# Patient Record
Sex: Male | Born: 1980 | Race: White | Hispanic: No | Marital: Married | Smoking: Never smoker
Health system: Southern US, Community
[De-identification: ages and names within clinical notes are randomized; demographics above are authoritative.]

## PROBLEM LIST (undated history)

## (undated) DIAGNOSIS — G43909 Migraine, unspecified, not intractable, without status migrainosus: Secondary | ICD-10-CM

## (undated) DIAGNOSIS — T7840XA Allergy, unspecified, initial encounter: Secondary | ICD-10-CM

## (undated) DIAGNOSIS — Z8619 Personal history of other infectious and parasitic diseases: Secondary | ICD-10-CM

## (undated) DIAGNOSIS — R011 Cardiac murmur, unspecified: Secondary | ICD-10-CM

## (undated) HISTORY — DX: Personal history of other infectious and parasitic diseases: Z86.19

## (undated) HISTORY — PX: WISDOM TOOTH EXTRACTION: SHX21

## (undated) HISTORY — DX: Migraine, unspecified, not intractable, without status migrainosus: G43.909

## (undated) HISTORY — DX: Allergy, unspecified, initial encounter: T78.40XA

## (undated) HISTORY — DX: Cardiac murmur, unspecified: R01.1

---

## 2002-05-09 ENCOUNTER — Encounter: Payer: Self-pay | Admitting: Emergency Medicine

## 2002-05-09 ENCOUNTER — Emergency Department (HOSPITAL_COMMUNITY): Admission: EM | Admit: 2002-05-09 | Discharge: 2002-05-10 | Payer: Self-pay | Admitting: Emergency Medicine

## 2005-01-29 ENCOUNTER — Emergency Department (HOSPITAL_COMMUNITY): Admission: EM | Admit: 2005-01-29 | Discharge: 2005-01-29 | Payer: Self-pay | Admitting: *Deleted

## 2005-09-11 ENCOUNTER — Encounter: Admission: RE | Admit: 2005-09-11 | Discharge: 2005-09-11 | Payer: Self-pay | Admitting: Internal Medicine

## 2005-09-11 ENCOUNTER — Ambulatory Visit: Payer: Self-pay | Admitting: Internal Medicine

## 2006-03-03 ENCOUNTER — Ambulatory Visit: Payer: Self-pay | Admitting: Internal Medicine

## 2006-05-07 ENCOUNTER — Ambulatory Visit: Payer: Self-pay | Admitting: Internal Medicine

## 2007-02-09 ENCOUNTER — Telehealth (INDEPENDENT_AMBULATORY_CARE_PROVIDER_SITE_OTHER): Payer: Self-pay | Admitting: *Deleted

## 2007-06-15 ENCOUNTER — Ambulatory Visit: Payer: Self-pay | Admitting: Internal Medicine

## 2007-06-24 ENCOUNTER — Encounter: Payer: Self-pay | Admitting: Internal Medicine

## 2007-10-19 ENCOUNTER — Telehealth (INDEPENDENT_AMBULATORY_CARE_PROVIDER_SITE_OTHER): Payer: Self-pay | Admitting: *Deleted

## 2008-04-15 ENCOUNTER — Telehealth (INDEPENDENT_AMBULATORY_CARE_PROVIDER_SITE_OTHER): Payer: Self-pay | Admitting: *Deleted

## 2008-04-18 ENCOUNTER — Encounter (INDEPENDENT_AMBULATORY_CARE_PROVIDER_SITE_OTHER): Payer: Self-pay | Admitting: *Deleted

## 2008-05-10 ENCOUNTER — Ambulatory Visit: Payer: Self-pay | Admitting: Cardiology

## 2008-05-10 ENCOUNTER — Encounter: Payer: Self-pay | Admitting: Internal Medicine

## 2008-05-10 LAB — CONVERTED CEMR LAB
BUN: 16 mg/dL
CO2: 29 meq/L
Calcium: 9.4 mg/dL
Chloride: 100 meq/L
Cholesterol: 149 mg/dL
Creatinine, Ser: 1.16 mg/dL
Glucose, Bld: 89 mg/dL
HDL: 50 mg/dL
LDL Cholesterol: 80 mg/dL
Potassium: 3.8 meq/L
Sodium: 137 meq/L
Total CHOL/HDL Ratio: 3
Triglycerides: 95 mg/dL
VLDL: 19 mg/dL

## 2008-05-11 ENCOUNTER — Ambulatory Visit (HOSPITAL_COMMUNITY): Admission: RE | Admit: 2008-05-11 | Discharge: 2008-05-11 | Payer: Self-pay | Admitting: Cardiology

## 2008-06-14 ENCOUNTER — Telehealth: Payer: Self-pay | Admitting: Internal Medicine

## 2008-07-27 ENCOUNTER — Ambulatory Visit: Payer: Self-pay | Admitting: Internal Medicine

## 2008-08-09 ENCOUNTER — Encounter: Payer: Self-pay | Admitting: Internal Medicine

## 2008-08-12 ENCOUNTER — Encounter (INDEPENDENT_AMBULATORY_CARE_PROVIDER_SITE_OTHER): Payer: Self-pay | Admitting: *Deleted

## 2009-03-15 ENCOUNTER — Ambulatory Visit: Payer: Self-pay | Admitting: Family Medicine

## 2009-07-28 ENCOUNTER — Ambulatory Visit: Payer: Self-pay | Admitting: Internal Medicine

## 2009-07-28 DIAGNOSIS — K648 Other hemorrhoids: Secondary | ICD-10-CM | POA: Insufficient documentation

## 2009-07-31 LAB — CONVERTED CEMR LAB
ALT: 24 units/L (ref 0–53)
AST: 21 units/L (ref 0–37)
BUN: 13 mg/dL (ref 6–23)
Basophils Absolute: 0 10*3/uL (ref 0.0–0.1)
Basophils Relative: 0.2 % (ref 0.0–3.0)
CO2: 31 meq/L (ref 19–32)
Calcium: 9.4 mg/dL (ref 8.4–10.5)
Chloride: 105 meq/L (ref 96–112)
Cholesterol: 127 mg/dL (ref 0–200)
Creatinine, Ser: 1 mg/dL (ref 0.4–1.5)
Eosinophils Relative: 1.8 % (ref 0.0–5.0)
GFR calc non Af Amer: 95.48 mL/min (ref >60)
HDL: 35.3 mg/dL — ABNORMAL LOW (ref >39.00)
Hemoglobin: 16.1 g/dL (ref 13.0–17.0)
LDL Cholesterol: 79 mg/dL (ref 0–99)
Lymphocytes Relative: 29.5 % (ref 12.0–46.0)
Lymphs Abs: 1.7 10*3/uL (ref 0.7–4.0)
MCHC: 35.4 g/dL (ref 30.0–36.0)
MCV: 94.6 fL (ref 78.0–100.0)
Monocytes Absolute: 0.6 10*3/uL (ref 0.1–1.0)
Monocytes Relative: 10.5 % (ref 3.0–12.0)
Neutro Abs: 3.4 10*3/uL (ref 1.4–7.7)
Platelets: 208 10*3/uL (ref 150.0–400.0)
Potassium: 4.2 meq/L (ref 3.5–5.1)
RBC: 4.8 M/uL (ref 4.22–5.81)
RDW: 11.4 % — ABNORMAL LOW (ref 11.5–14.6)
Sodium: 142 meq/L (ref 135–145)
TSH: 0.79 microintl units/mL (ref 0.35–5.50)
Total CHOL/HDL Ratio: 4
VLDL: 13.2 mg/dL (ref 0.0–40.0)
WBC: 5.8 10*3/uL (ref 4.5–10.5)

## 2010-04-24 ENCOUNTER — Telehealth (INDEPENDENT_AMBULATORY_CARE_PROVIDER_SITE_OTHER): Payer: Self-pay | Admitting: *Deleted

## 2010-05-01 ENCOUNTER — Ambulatory Visit: Payer: Self-pay | Admitting: Internal Medicine

## 2010-05-01 LAB — CONVERTED CEMR LAB: Rapid Strep: POSITIVE

## 2010-05-31 ENCOUNTER — Ambulatory Visit: Payer: Self-pay | Admitting: Internal Medicine

## 2010-08-13 ENCOUNTER — Ambulatory Visit: Payer: Self-pay | Admitting: Internal Medicine

## 2010-08-22 ENCOUNTER — Ambulatory Visit: Payer: Self-pay | Admitting: Internal Medicine

## 2010-08-24 LAB — CONVERTED CEMR LAB
BUN: 17 mg/dL (ref 6–23)
Basophils Absolute: 0 10*3/uL (ref 0.0–0.1)
Chloride: 103 meq/L (ref 96–112)
Cholesterol: 141 mg/dL (ref 0–200)
Creatinine, Ser: 1 mg/dL (ref 0.4–1.5)
Eosinophils Absolute: 0.1 10*3/uL (ref 0.0–0.7)
Eosinophils Relative: 1.7 % (ref 0.0–5.0)
MCHC: 34.6 g/dL (ref 30.0–36.0)
MCV: 92.7 fL (ref 78.0–100.0)
Monocytes Absolute: 0.7 10*3/uL (ref 0.1–1.0)
Neutrophils Relative %: 64.7 % (ref 43.0–77.0)
Platelets: 235 10*3/uL (ref 150.0–400.0)
Triglycerides: 110 mg/dL (ref 0.0–149.0)
WBC: 7.9 10*3/uL (ref 4.5–10.5)

## 2010-09-20 ENCOUNTER — Encounter: Payer: Self-pay | Admitting: Internal Medicine

## 2010-10-11 ENCOUNTER — Encounter: Payer: Self-pay | Admitting: Internal Medicine

## 2010-10-11 ENCOUNTER — Ambulatory Visit (INDEPENDENT_AMBULATORY_CARE_PROVIDER_SITE_OTHER): Payer: 59 | Admitting: Internal Medicine

## 2010-10-11 DIAGNOSIS — L02519 Cutaneous abscess of unspecified hand: Secondary | ICD-10-CM

## 2010-10-11 DIAGNOSIS — L03019 Cellulitis of unspecified finger: Secondary | ICD-10-CM

## 2010-10-11 NOTE — Assessment & Plan Note (Signed)
Summary: strep throat?//lch   Vital Signs:  Patient profile:   30 year old male Height:      68.5 inches Weight:      207 pounds Temp:     99.7 degrees F oral Pulse rate:   80 / minute Resp:     15 per minute BP sitting:   110 / 82  (left arm)  Vitals Entered By: Jeremy Johann CMA (May 01, 2010 4:17 PM)  CC:  URI symptoms.  History of Present Illness:  URI Symptoms      This is a 30 year old man who presents with URI symptoms sincelast night ; onset as ST with chills.  The patient reports sore throat, but denies nasal congestion, purulent nasal discharge, productive cough, and earache.  Associated symptoms include chills, low-grade fever (<100.5 degrees), and  minimal dyspnea.  The patient denies wheezing, vomiting, and diarrhea.  The patient also reports some  fatigue.  The patient denies frontal  headache.  Risk factors for Strep sinusitis include tender adenopathy.  The patient denies the following risk factors for Strep sinusitis:  facial pain and tooth pain.   Rx: NSAIDS, fluids  Current Medications (verified): 1)  Xanax 0.5 Mg  Tabs (Alprazolam) .Marland Kitchen.. 1- 1 1/2 Once Daily As Needed  Allergies (verified): No Known Drug Allergies  Physical Exam  General:  Appears uncomfortable but in no acute distress; alert,appropriate and cooperative throughout examination Ears:  External ear exam shows no significant lesions or deformities.  Otoscopic examination reveals clear canals, tympanic membranes are intact bilaterally without bulging, retraction, inflammation or discharge. Hearing is grossly normal bilaterally. Nose:  External nasal examination shows no deformity or inflammation. Nasal mucosa are pink and moist without lesions or exudates. Mouth:  Oral mucosa and oropharynx without lesions or exudates.  Teeth in good repair 1+ tonsil hypertrophy with erythema  Lungs:  Normal respiratory effort, chest expands symmetrically. Lungs are clear to auscultation, no crackles or  wheezes. Heart:  Normal rate and regular rhythm. S1 and S2 normal without gallop, murmur, click, rub or other extra sounds. Skin:  Intact without suspicious lesions or rashes. Damp Cervical Nodes:  Tender LA , R > L Axillary Nodes:  No palpable lymphadenopathy   Impression & Recommendations:  Problem # 1:  PHARYNGITIS-ACUTE (ICD-462)  Orders: TLB-Beta Strep Screen (87880-STRSCR) Rapid Strep (30865)  His updated medication list for this problem includes:    Amoxicillin 500 Mg Caps (Amoxicillin) .Marland Kitchen... 1 three times a day  Problem # 2:  FEVER (ICD-780.60)  Orders: TLB-Beta Strep Screen (87880-STRSCR) Rapid Strep (78469)  Complete Medication List: 1)  Xanax 0.5 Mg Tabs (Alprazolam) .Marland Kitchen.. 1- 1 1/2 once daily as needed 2)  Amoxicillin 500 Mg Caps (Amoxicillin) .Marland Kitchen.. 1 three times a day  Patient Instructions: 1)  Drink as much fluid as you can tolerate for the next few days. 2)  Take 650-1000mg  of Tylenol every 4-6 hours as needed for relief of pain or comfort of fever AVOID taking more than 4000mg   in a 24 hour period (can cause liver damage in higher doses) OR take 400-600mg  of Ibuprofen (Advil, Motrin) with food every 4-6 hours as needed for relief of pain or comfort of fever.  3)  Recommended remaining out of work for 05/02/2010. Zinc lozenges or Zicam Melts as needed for sore throat. Prescriptions: AMOXICILLIN 500 MG CAPS (AMOXICILLIN) 1 three times a day  #30 x 0   Entered and Authorized by:   Marga Melnick MD   Signed by:  Marga Melnick MD on 05/01/2010   Method used:   Faxed to ...       [facility] [address] #[id number]* (retail)       [address]       [place], [place]  [address]       Ph: [phone]       Fax: [phone]   RxID:   [id number]   Laboratory Results   Date/Time Reported: May 01, 2010 4:42 PM  Other Tests  Rapid Strep: positive

## 2010-10-11 NOTE — Assessment & Plan Note (Signed)
Summary: shin bruised/cbs   Vital Signs:  Patient profile:   30 year old male Weight:      204.4 pounds BMI:     30.74 Temp:     98.5 degrees F oral Pulse rate:   64 / minute Resp:     12 per minute BP sitting:   104 / 68  (left arm) Cuff size:   large  Vitals Entered By: Shonna Chock CMA (May 31, 2010 10:49 AM) CC: Right leg injury x 1 week   CC:  Right leg injury x 1 week.  History of Present Illness: Kicked in L shin playing soccer 05/23/2010; acute swelling treated with ice , elevation & NSAIDS. The pain has persisted, worse with walking & "knot" unchanged.  Current Medications (verified): 1)  Xanax 0.5 Mg  Tabs (Alprazolam) .Marland Kitchen.. 1- 1 1/2 Once Daily As Needed  Allergies (verified): No Known Drug Allergies  Review of Systems General:  Denies chills, fever, and sweats. Resp:  Denies chest pain with inspiration, coughing up blood, and shortness of breath.  Physical Exam  General:  well-nourished,in no acute distress; alert,appropriate and cooperative throughout examination Lungs:  Normal respiratory effort, chest expands symmetrically. Lungs are clear to auscultation, no crackles or wheezes. Heart:  regular rhythm, no murmur, no gallop, no rub, and bradycardia.   Pulses:  R and L dorsalis pedis and posterior tibial pulses are full and equal bilaterally Extremities:  10X 8 cm ecchymosis with 3X3 cm fluctuant hematoma medial to R tibia. Neg Homan's sign   Impression & Recommendations:  Problem # 1:  CONTUSION OF LOWER LEG (ICD-924.10)  Problem # 2:  HEMATOMA (ICD-924.9) secondary to above   Complete Medication List: 1)  Xanax 0.5 Mg Tabs (Alprazolam) .Marland Kitchen.. 1- 1 1/2 once daily as needed 2)  Tramadol Hcl 50 Mg Tabs (Tramadol hcl) .Marland Kitchen.. 1 every 6 hrs as needed for pain  Patient Instructions: 1)  Report Warning Signs as discussed. Measure the bruise & hematoma . Warm , moist compresses three times a day as needed . Prescriptions: TRAMADOL HCL 50 MG TABS  (TRAMADOL HCL) 1 every 6 hrs as needed for pain  #30 x 0   Entered and Authorized by:   Marga Melnick MD   Signed by:   Marga Melnick MD on 05/31/2010   Method used:   Print then Give to Patient   RxID:   [id number]

## 2010-10-11 NOTE — Assessment & Plan Note (Signed)
Summary: cpx/cbs   Vital Signs:  Patient profile:   30 year old male Height:      68.5 inches Weight:      214.13 pounds Pulse rate:   84 / minute Pulse rhythm:   regular BP sitting:   132 / 80  (left arm) Cuff size:   large  Vitals Entered By: Army Fossa CMA (August 13, 2010 3:05 PM) CC: CPX, not fasting  Comments [facility] [address]  pt states he noticied breathing probelm R Nostril.    History of Present Illness: CPX Right nostril congestion for a while, no discharge. Denies allergy symptoms such itchy nose or eyes  Current Medications (verified): 1)  Xanax 0.5 Mg  Tabs (Alprazolam) .Marland Kitchen.. 1- 1 1/2 Once Daily As Needed  Allergies (verified): No Known Drug Allergies  Past History:  Past Medical History: Reviewed history from 07/28/2009 and no changes required. CHEST PAIN, EXERTIONAL   -- s/p CT angiogram (-) per patien      Past Surgical History: Reviewed history from 07/27/2008 and no changes required. no  Family History: DM-- no CAD--GP colon ca--no prostate ca--F in his 77s  lung ca (+)  skin ca (+)  breast ca (+)  PE - PGF  The patient's grandfather had coronary bypass grafting   in his 5s.  His other grandfather had PCI in his 82s as well.  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  2009 Alcohol Use - yes - 2 beers every other day Regular Exercise - yes, 1-2 x week, soccer     Review of Systems General:  Denies fatigue and fever. CV:  Denies chest pain or discomfort and swelling of feet. Resp:  Denies cough and shortness of breath. GI:  Denies bloody stools, diarrhea, nausea, and vomiting. GU:  Denies dysuria and hematuria. Psych:  Denies anxiety and depression.  Physical Exam  General:  alert, well-developed, and well-nourished.   Nose:  right nostril quite congested Neck:  no masses and no thyromegaly.     Lungs:  normal respiratory effort, no intercostal retractions, no accessory muscle use, and normal breath sounds.   Heart:  normal rate, regular rhythm, no murmur, and no gallop.   Abdomen:  soft, non-tender, no distention, no masses, no guarding, and no rigidity.   Extremities:  no pretibial edema bilaterally    Impression & Recommendations:  Problem # 1:  HEALTH SCREENING (ICD-V70.0) Td 2009 had flu shot d/w patient diet-exercise reminded about [facility]        Problem # 2:  ? 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 Trial with Flonase ENT referral  Orders: ENT Referral (ENT)  Complete Medication List: 1)  Xanax 0.5 Mg Tabs (Alprazolam) .Marland Kitchen.. 1- 1 1/2 once daily as needed 2)  Flonase 50 Mcg/act Susp (Fluticasone propionate) .... 2 sprays in each side of the nose daily  Patient Instructions: 1)  flonase as prescribed 2)  please came back  fasting 3)  BMP CBC FLP TSH ---dx v70 4)  Please schedule a follow-up appointment in 1 year.  I will aPrescriptions: XANAX 0.5 MG  TABS (ALPRAZOLAM) 1- 1 1/2 once daily as needed  #30 x 0   Entered and Authorized by:   Elita Quick E. Paz MD   Signed by:   Nolon Rod. Paz MD on 08/13/2010   Method used:   Print  then Give to Patient   RxID:   [id number] FLONASE 50 MCG/ACT SUSP (FLUTICASONE PROPIONATE) 2 sprays in each side of the nose daily  #1 x 2   Entered and Authorized by:   Nolon Rod. Paz MD   Signed by:   Nolon Rod. Paz MD on 08/13/2010   Method used:   Print then Give to Patient   RxID:   [id number]    Orders Added: 1)  ENT Referral [ENT] 2)  Est. Patient age 70-39 [99395]   Immunization History:  Influenza Immunization History:    Influenza:  historical (06/09/2010)   Immunization History:  Influenza Immunization History:    Influenza:  Historical (06/09/2010)

## 2010-10-11 NOTE — Progress Notes (Signed)
Summary: Refill Request  Phone Note Refill Request Message from:  Patient on April 24, 2010 3:46 PM  Refills Requested: Medication #1:  XANAX 0.5 MG  TABS 1- 1 1/2 once daily as needed.   Dosage confirmed as above?Dosage Confirmed   Supply Requested: 1 month Patient needs this for flight in a few weeks.    Method Requested: . Next Appointment Scheduled: none Initial call taken by: Harold Barban,  April 24, 2010 3:46 PM  Follow-up for Phone Call        ok #30, no Rf Jose E. Paz MD  April 25, 2010 9:15 AM     Prescriptions: XANAX 0.5 MG  TABS (ALPRAZOLAM) 1- 1 1/2 once daily as needed  #30 x 0   Entered by:   Army Fossa CMA   Authorized by:   Nolon Rod. Paz MD   Signed by:   Army Fossa CMA on 04/25/2010   Method used:   Printed then faxed to ...       [facility] [address] [facility] [address] #[id number]* (retail)       [address]       [place], [place]  [address]       Ph: [phone]       Fax: [phone]   RxID:   [id number]

## 2010-10-11 NOTE — Consult Note (Signed)
Summary: [facility]  [facility]   Imported By: Maryln Gottron 10/03/2010 15:50:23  _____________________________________________________________________  External Attachment:    Type:   Image     Comment:   External Document

## 2010-10-17 NOTE — Assessment & Plan Note (Signed)
Summary: INFECTION ON KNUCKLE/KN   Vital Signs:  Patient profile:   30 year old male Weight:      211.38 pounds Temp:     98.2 degrees F oral Pulse rate:   68 / minute Pulse rhythm:   regular BP sitting:   124 / 82  (left arm) Cuff size:   large  Vitals Entered By: Army Fossa CMA (October 11, 2010 11:03 AM) CC: Infection in finger  Comments x 1 week [facility] [address]    History of Present Illness:  cut  his left fourth finger a week ago, 2 days ago the area become red, swollen tender  ROS No fever No discharge     Current Medications (verified): 1)  Xanax 0.5 Mg  Tabs (Alprazolam) .Marland Kitchen.. 1- 1 1/2 Once Daily As Needed 2)  Flonase 50 Mcg/act Susp (Fluticasone Propionate) .... 2 Sprays in Each Side of The Nose Daily  Allergies (verified): No Known Drug Allergies  Past History:  Past Medical History: Reviewed history from 07/28/2009 and no changes required. CHEST PAIN, EXERTIONAL   -- s/p CT angiogram (-) per patien      Past Surgical History: Reviewed history from 07/27/2008 and no changes required. no  Physical Exam  General:  alert and well-developed.   Extremities:  left fourth finger @ the dorsal aspect of the PIP he has a 4 mm area of redness, swelling and tenderness. No discharge, no fluctuancy. The finger is slightly swollen otherwise normal. PIP RANGE OF motion slightly decreased d/t  swelling.   Impression & Recommendations:  Problem # 1:  CELLULITIS, FINGER (ICD-681.00) early cellulitis, patient is concerned because his mom had a staph infection in the finger and it  got pretty severe. We'll prescribe cephalexin and Bactroban. Will call if no better in 2 days His updated medication list for this problem includes:    Cephalexin 500 Mg Caps (Cephalexin) .Marland Kitchen... 1 by mouth every 6 hours x 5 days  Complete Medication List: 1)  Xanax 0.5 Mg Tabs (Alprazolam) .Marland Kitchen.. 1- 1 1/2 once daily as needed 2)  Flonase 50 Mcg/act Susp (Fluticasone propionate)  .... 2 sprays in each side of the nose daily 3)  Cephalexin 500 Mg Caps (Cephalexin) .Marland Kitchen.. 1 by mouth every 6 hours x 5 days 4)  Bactroban 2 % Crea (Mupirocin calcium) .... Apply two times a day x 5 days Prescriptions: BACTROBAN 2 % CREA (MUPIROCIN CALCIUM) apply two times a day x 5 days  #1 x 0   Entered and Authorized by:   Nolon Rod. Paz MD   Signed by:   Nolon Rod. Paz MD on 10/11/2010   Method used:   Print then Give to Patient   RxID:   [id number] CEPHALEXIN 500 MG CAPS (CEPHALEXIN) 1 by mouth every 6 hours x 5 days  #20 x 0   Entered and Authorized by:   Nolon Rod. Paz MD   Signed by:   Nolon Rod. Paz MD on 10/11/2010   Method used:   Print then Give to Patient   RxID:   [id number]    Orders Added: 1)  Est. Patient Level II [43329]

## 2011-01-22 NOTE — Assessment & Plan Note (Signed)
[facility]                            CARDIOLOGY OFFICE NOTE   Gregory Novak, Gregory Novak                     MRN:          [id number]  DATE:05/10/2008                            DOB:          [birth year]    PRIMARY CARE PHYSICIAN:  Dr. Willow Ora.   HISTORY OF PRESENT ILLNESS:  This is a 30 year old with no significant  past medical history who presents to the cardiology clinic for  evaluation of chest pain with exertion.  The patient states that, for  the last year-and-a-half to 2 years he has been getting a sharp pressure  feeling in his substernal region with jogging.  This is reproduced every  time he jogs and tends to come after he jogs about three-quarters of a  mile.  He describes its level as moderate 5/10 to 6/10 pain.  It does  radiate to his back.  Does not radiate to his arm or to his neck.  When  he does get the chest pressure, it is severe enough that he has to quit  running.  It will resolve with rest in 2 to 3 minutes and he can at that  point start running again without getting chest pain.  These symptoms  have been stable the last 1.5 to 2 years.  He also plays soccer and he  states he does not get these symptoms with soccer and really only  happens when he jogs, and reproducibly occurs about three-quarters of a  mile.  He does not get shortness of breath associated with these  symptoms.  Has had no episodes of syncope, palpitations or  lightheadedness.  He has no orthopnea or PND.   PAST MEDICAL HISTORY:  The patient really has no significant past  medical history.   The patient takes no medications.   EKG today is normal sinus rhythm and is overall a normal EKG with no Q  waves.  No ST-T wave abnormalities.   FAMILY HISTORY:  The patient's grandfather had coronary bypass grafting  in his 21s.  His other grandfather had PCI in his 40s as well.   SOCIAL HISTORY:  The patient is married.  He works for the [employer].  He  was a smoker in college.  However, does not smoke now.  He drinks approximately 2 beers a day.  He did use cocaine while he was  in college.  However, he has not used any now for the last 3 to 4 years.  He does not use any other illicit drugs.   REVIEW OF SYSTEMS:  Negative, except as noted in the history of present  illness.   PHYSICAL EXAMINATION:  VITAL SIGNS:  Blood pressure 128/78, heart rate  is 67 and regular, weight is 195 pounds.  GENERAL:  Well-developed male in no apparent distress.  NEUROLOGICAL:  Alert and oriented x3.  There is a normal affect.  HEENT:  Normal.  NECK:  No JVD.  No thyromegaly or thyroid nodule.  LUNGS:  Clear to auscultation bilaterally with normal respiratory  effort.  HEART:  Regular S1, S2.  No S3.  No S4.  No murmur.  There is no  peripheral edema.  They are 2+ posterior tibial pulses bilaterally.  ABDOMEN:  Soft, nontender.  No hepatosplenomegaly.  Bowel sounds  present.  MUSCULOSKELETAL:  Normal exam.  SKIN:  Normal exam.  EXTREMITIES:  No clubbing or cyanosis.   ASSESSMENT/PLAN:  Is a 30 year old with a history of exertional chest  pain/pressure that happens reproducibly when he jogs.  Stable symptoms  last 2 years.  It resolves with rest.  EKG reviewed today and is normal.  There is no evidence for a hypertrophic cardiomyopathy pattern.  He does  not have any murmurs on exam.  I think probably based on exam and EKG  that hypertrophic cardiomyopathy is not very likely.  He does not have a  family history of early coronary disease.  To have coronary disease in  someone this young, you would expect him to have a familial  hypercholesterolemia, which there is no evidence for him having based on  his parents and his grandparents.  Finally, another option for a cardiac  source of exertional chest pain for him would be an anomalous coronary  artery with an interarterial path.  I think the workup for him will be:  1. Obtain fasting lipid profile to  rule out significant      hyperlipidemia.  2. Obtain a coronary CT angiogram.  This will allow [place] to look for      coronary artery anomalies as well as to look for any evidence of      hypertrophic cardiomyopathy on the cine images of the heart.  I did      discuss the risk of radiation dosage with him in detail and he      wishes to proceed.     Marca Ancona, MD  Electronically Signed    DM/MedQ  DD: 05/10/2008  DT: 05/10/2008  Job #: [id number]   cc:   Willow Ora, MD  Everardo Beals. Juanda Chance, MD, [facility]

## 2011-08-13 ENCOUNTER — Encounter: Payer: Self-pay | Admitting: Internal Medicine

## 2011-08-15 ENCOUNTER — Encounter: Payer: Self-pay | Admitting: Internal Medicine

## 2011-08-15 ENCOUNTER — Ambulatory Visit (INDEPENDENT_AMBULATORY_CARE_PROVIDER_SITE_OTHER): Payer: 59 | Admitting: Internal Medicine

## 2011-08-15 DIAGNOSIS — Z0000 Encounter for general adult medical examination without abnormal findings: Secondary | ICD-10-CM | POA: Insufficient documentation

## 2011-08-15 DIAGNOSIS — J309 Allergic rhinitis, unspecified: Secondary | ICD-10-CM | POA: Insufficient documentation

## 2011-08-15 DIAGNOSIS — G4700 Insomnia, unspecified: Secondary | ICD-10-CM | POA: Insufficient documentation

## 2011-08-15 LAB — LIPID PANEL
Cholesterol: 147 mg/dL (ref 0–200)
Total CHOL/HDL Ratio: 3
Triglycerides: 87 mg/dL (ref 0.0–149.0)

## 2011-08-15 LAB — COMPREHENSIVE METABOLIC PANEL
Albumin: 4.6 g/dL (ref 3.5–5.2)
BUN: 18 mg/dL (ref 6–23)
CO2: 27 mEq/L (ref 19–32)
Calcium: 9 mg/dL (ref 8.4–10.5)
Chloride: 103 mEq/L (ref 96–112)
Glucose, Bld: 88 mg/dL (ref 70–99)
Potassium: 3.9 mEq/L (ref 3.5–5.1)

## 2011-08-15 MED ORDER — ALPRAZOLAM 0.5 MG PO TABS: 0.5000 mg | ORAL_TABLET | Freq: Every evening | ORAL | Status: DC | PRN

## 2011-08-15 MED ORDER — FLUTICASONE PROPIONATE 50 MCG/ACT NA SUSP: 2.0000 | Freq: Every day | NASAL | Status: DC

## 2011-08-15 NOTE — Progress Notes (Signed)
  Subjective:    Patient ID: Gregory Novak, male    DOB: Jul 17, 1981, 30 y.o.   MRN: [id number]  HPI  CPX  Past Medical History: CHEST PAIN, EXERTIONAL   -- s/p CT angiogram (-) per patient allergies insomnia  Past Surgical History: no  Family History: DM-- no CAD--GF: CABG age 62 , the other GF had PCI in his 67s as well. 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  Respiratory: Negative for cough and shortness of breath.   Cardiovascular: Negative for chest pain and leg swelling.  Gastrointestinal: Negative for nausea, abdominal pain and diarrhea.  Genitourinary: Negative for dysuria and hematuria.  Psychiatric/Behavioral:       No anxiety, depression       Objective:   Physical Exam  Constitutional: He is oriented to person, place, and time. He appears well-developed and well-nourished. No distress.  HENT:  Head: Normocephalic and atraumatic.  Neck:       Nl carotid  Cardiovascular: Normal rate, regular rhythm and normal heart sounds.   No murmur heard. Pulmonary/Chest: Effort normal and breath sounds normal. No respiratory distress. He has no wheezes. He has no rales.  Abdominal: Soft. He exhibits no distension. There is no tenderness. There is no rebound and no guarding.  Musculoskeletal: He exhibits no edema.  Neurological: He is alert and oriented to person, place, and time.  Skin: Skin is warm and dry. He is not diaphoretic.  Psychiatric: He has a normal mood and affect. His behavior is normal. Judgment and thought content normal.            Assessment & Plan:

## 2011-08-15 NOTE — Assessment & Plan Note (Signed)
Td 09 Had a flu shot STE, diet, exercise: counseled Labs  RF chronic meds

## 2011-08-17 ENCOUNTER — Encounter: Payer: Self-pay | Admitting: Internal Medicine

## 2012-01-02 ENCOUNTER — Ambulatory Visit (INDEPENDENT_AMBULATORY_CARE_PROVIDER_SITE_OTHER): Payer: 59 | Admitting: Internal Medicine

## 2012-01-02 ENCOUNTER — Telehealth: Payer: Self-pay | Admitting: Internal Medicine

## 2012-01-02 ENCOUNTER — Encounter: Payer: Self-pay | Admitting: Internal Medicine

## 2012-01-02 VITALS — BP 130/82 | HR 71 | Temp 98.1°F | Wt 218.2 lb

## 2012-01-02 DIAGNOSIS — S8990XA Unspecified injury of unspecified lower leg, initial encounter: Secondary | ICD-10-CM

## 2012-01-02 DIAGNOSIS — S91219A Laceration without foreign body of unspecified toe(s) with damage to nail, initial encounter: Secondary | ICD-10-CM

## 2012-01-02 DIAGNOSIS — S99929A Unspecified injury of unspecified foot, initial encounter: Secondary | ICD-10-CM

## 2012-01-02 DIAGNOSIS — S91109A Unspecified open wound of unspecified toe(s) without damage to nail, initial encounter: Secondary | ICD-10-CM

## 2012-01-02 NOTE — Patient Instructions (Signed)
Dip gauze in  sterile saline and applied to the wound twice a day. Cover the wound with Telfa , non stick dressing  without any antibiotic ointment. The saline can be purchased at the drugstore or you can make your own .Boil cup of salt in a gallon of water. Store mixture  in a clean container.Report Warning  signs as discussed (red streaks, pus, fever, increasing pain). 

## 2012-01-02 NOTE — Progress Notes (Signed)
  Subjective:    Patient ID: Gregory Novak, male    DOB: [birth year], 31 y.o.   MRN: [id number]  HPI   Pt hit right toe on metal camping cot last night around 1130pm. Pt had tetanus shot in last two years. Pt cleaned toe with water. Pt elevated foot all night on a pillow which helped stop the bleeding. Toe nail looks detached from skin for the majority of the nail. Toe nail is grey in color with dried blood. Pt has full ROM of toe and right foot. Pt denies loss of sensation in right foot.  Pt denies diabetes. Pt does not take any blood thinners. Pt took naproxen last night.  Review of Systems     Objective:   Physical Exam He appears healthy and well-nourished in no distress Pedal pulses are intact. Hair growth is good. There is some dried blood under the distal right great toenail. There appears to be a laceration across the base of that nail. The nail cannot be displaced with pressure. There is no pain with flexion of the toe or  with compression of the toe.          Assessment & Plan:    #1 blunt trauma right toe with toenail laceration.  Plan: See recommendations

## 2012-01-02 NOTE — Telephone Encounter (Signed)
Caller: Pearley/Patient; PCP: Willow Ora; CB#: [phone]; ; ; Call regarding Injury To Right Big Toe;  Pt calling to make an appt for Rt big toe injury. Stubbed toe in kitchen 4/24 pm. Nail is split. Afebrile. Bleeding easily stopped last pm. Pt has taken Naproxen for pain relief. Disp: See w/in 4 hrs per Foot Injury. Ok to see w/in 8 hrs per Nursing Judgement. No 4 hr appt's open. Appt made for 1615 on 4/25 with Dr. Alwyn Ren. Call back parameters given.

## 2012-01-02 NOTE — Telephone Encounter (Signed)
Thank you :)

## 2012-09-14 ENCOUNTER — Encounter: Payer: Self-pay | Admitting: *Deleted

## 2012-09-15 ENCOUNTER — Encounter: Payer: Self-pay | Admitting: Internal Medicine

## 2012-09-15 ENCOUNTER — Ambulatory Visit (INDEPENDENT_AMBULATORY_CARE_PROVIDER_SITE_OTHER): Payer: 59 | Admitting: Internal Medicine

## 2012-09-15 VITALS — BP 124/82 | HR 64 | Temp 98.0°F | Ht 70.0 in | Wt 211.0 lb

## 2012-09-15 DIAGNOSIS — Z23 Encounter for immunization: Secondary | ICD-10-CM

## 2012-09-15 DIAGNOSIS — Z0000 Encounter for general adult medical examination without abnormal findings: Secondary | ICD-10-CM

## 2012-09-15 DIAGNOSIS — G4700 Insomnia, unspecified: Secondary | ICD-10-CM

## 2012-09-15 DIAGNOSIS — J309 Allergic rhinitis, unspecified: Secondary | ICD-10-CM

## 2012-09-15 LAB — CBC WITH DIFFERENTIAL/PLATELET
Basophils Absolute: 0 10*3/uL (ref 0.0–0.1)
Basophils Relative: 0.4 % (ref 0.0–3.0)
Eosinophils Relative: 2.7 % (ref 0.0–5.0)
HCT: 45.5 % (ref 39.0–52.0)
Hemoglobin: 15.9 g/dL (ref 13.0–17.0)
Lymphocytes Relative: 32.5 % (ref 12.0–46.0)
Lymphs Abs: 1.8 10*3/uL (ref 0.7–4.0)
MCHC: 35 g/dL (ref 30.0–36.0)
Monocytes Relative: 8.6 % (ref 3.0–12.0)
Neutro Abs: 3.1 10*3/uL (ref 1.4–7.7)
Platelets: 214 10*3/uL (ref 150.0–400.0)
RDW: 12.3 % (ref 11.5–14.6)
WBC: 5.6 10*3/uL (ref 4.5–10.5)

## 2012-09-15 LAB — LIPID PANEL
Cholesterol: 151 mg/dL (ref 0–200)
LDL Cholesterol: 90 mg/dL (ref 0–99)
Total CHOL/HDL Ratio: 3
Triglycerides: 76 mg/dL (ref 0.0–149.0)

## 2012-09-15 LAB — TSH: TSH: 1.53 u[IU]/mL (ref 0.35–5.50)

## 2012-09-15 MED ORDER — FLUTICASONE PROPIONATE 50 MCG/ACT NA SUSP: 2.0000 | Freq: Every day | NASAL | Status: DC

## 2012-09-15 MED ORDER — ALPRAZOLAM 0.5 MG PO TABS: 0.5000 mg | ORAL_TABLET | Freq: Every evening | ORAL | Status: DC | PRN

## 2012-09-15 NOTE — Progress Notes (Signed)
  Subjective:    Patient ID: Gregory Novak, male    DOB: 03/12/1981, 32 y.o.   MRN: [id number]  HPI CPX  Past Medical History  Diagnosis Date  . Chest pain     exertional, reports (-) CT angio of the chest  . Allergic rhinitis 08/15/2011  . Insomnia 08/15/2011   Past Surgical History  Procedure Date  . No past surgeries    History   Social History  . Marital Status: Single    Spouse Name: N/A    Number of Children: 0  . Years of Education: N/A   Occupational History  . printing  [employer]   Social History Main Topics  . Smoking status: Former Games developer  . Smokeless tobacco: Never Used  . Alcohol Use: Yes     Comment: socially   . Drug Use: No  . Sexually Active: Not on file   Other Topics Concern  . Not on file   Social History Narrative   Married 07-2012, no children   Family History  Problem Relation Age of Onset  . Cancer Father 66    prostate  . Diabetes Neg Hx   . Coronary artery disease Other     GP  . Cancer Other     lung,skin,breast  . Stroke Neg Hx     Review of Systems Still has some nasal congestion, currently not taking Flonase, does not see any major difference when takes it. Has not been taking any oral OTCs for allergies. Reports clear nasal discharge on and off and very rarely some sneezing. Rarely uses Xanax No chest pain or shortness of breath No nausea, vomiting, diarrhea. No dysuria or gross hematuria Was seen with hemorrhoids, feels a lot better.    Objective:   Physical Exam General -- alert, well-developed, and well-nourished.   HEENT -- TMs normal, throat w/o redness, face symmetric and not tender to palpation, nose clear Lungs -- normal respiratory effort, no intercostal retractions, no accessory muscle use, and normal breath sounds.   Heart-- normal rate, regular rhythm, no murmur, and no gallop.   Abdomen--soft, non-tender, no distention, no masses, no HSM, no guarding, and no rigidity.   Neurologic-- alert &  oriented X3 and strength normal in all extremities. Psych-- Cognition and judgment appear intact. Alert and cooperative with normal attention span and concentration.  not anxious appearing and not depressed appearing.      Assessment & Plan:

## 2012-09-15 NOTE — Assessment & Plan Note (Signed)
Hardly ever uses alprazolam, uses it  mostly when he flies.

## 2012-09-15 NOTE — Assessment & Plan Note (Signed)
Td 09 flu shot today STE, diet, exercise: counseled Labs

## 2012-09-15 NOTE — Assessment & Plan Note (Signed)
Ongoing nasal congestion, saw ENT 09/2010, was diagnosed with hypertrophic nasal turbinates and prescribe Flonase Plan: Go back on Flonase OTC Claritin Further  eval if not improved, patient will call

## 2012-09-16 ENCOUNTER — Encounter: Payer: Self-pay | Admitting: *Deleted

## 2012-10-08 ENCOUNTER — Encounter: Payer: Self-pay | Admitting: Internal Medicine

## 2013-09-15 ENCOUNTER — Telehealth: Payer: Self-pay

## 2013-09-15 NOTE — Telephone Encounter (Signed)
Left message for call back Non identifiable  Tdap--11/09

## 2013-09-16 NOTE — Telephone Encounter (Signed)
Unable to reach pre visit -- phone tag

## 2013-09-16 NOTE — Telephone Encounter (Signed)
Left message for call back Non identifiable  

## 2013-09-16 NOTE — Telephone Encounter (Signed)
Patient called back for pre-visit call.

## 2013-09-17 ENCOUNTER — Encounter: Payer: Self-pay | Admitting: Internal Medicine

## 2013-09-17 ENCOUNTER — Ambulatory Visit (INDEPENDENT_AMBULATORY_CARE_PROVIDER_SITE_OTHER): Payer: 59 | Admitting: Internal Medicine

## 2013-09-17 VITALS — BP 116/76 | HR 69 | Temp 98.0°F | Ht 69.5 in | Wt 220.0 lb

## 2013-09-17 DIAGNOSIS — Z0000 Encounter for general adult medical examination without abnormal findings: Secondary | ICD-10-CM

## 2013-09-17 DIAGNOSIS — G4700 Insomnia, unspecified: Secondary | ICD-10-CM

## 2013-09-17 DIAGNOSIS — Z23 Encounter for immunization: Secondary | ICD-10-CM

## 2013-09-17 DIAGNOSIS — J309 Allergic rhinitis, unspecified: Secondary | ICD-10-CM

## 2013-09-17 DIAGNOSIS — K648 Other hemorrhoids: Secondary | ICD-10-CM

## 2013-09-17 LAB — COMPREHENSIVE METABOLIC PANEL
ALT: 39 U/L (ref 0–53)
AST: 24 U/L (ref 0–37)
Albumin: 4.2 g/dL (ref 3.5–5.2)
Alkaline Phosphatase: 65 U/L (ref 39–117)
BILIRUBIN TOTAL: 0.8 mg/dL (ref 0.3–1.2)
BUN: 16 mg/dL (ref 6–23)
CO2: 28 meq/L (ref 19–32)
CREATININE: 1 mg/dL (ref 0.4–1.5)
Calcium: 9 mg/dL (ref 8.4–10.5)
Chloride: 106 mEq/L (ref 96–112)
GFR: 93.81 mL/min (ref >60.00)
GLUCOSE: 89 mg/dL (ref 70–99)
Potassium: 4 mEq/L (ref 3.5–5.1)
Sodium: 138 mEq/L (ref 135–145)
Total Protein: 6.9 g/dL (ref 6.0–8.3)

## 2013-09-17 LAB — LIPID PANEL
CHOLESTEROL: 157 mg/dL (ref 0–200)
HDL: 38.2 mg/dL — AB (ref >39.00)
LDL Cholesterol: 103 mg/dL — ABNORMAL HIGH (ref 0–99)
TRIGLYCERIDES: 77 mg/dL (ref 0.0–149.0)
Total CHOL/HDL Ratio: 4
VLDL: 15.4 mg/dL (ref 0.0–40.0)

## 2013-09-17 MED ORDER — ALPRAZOLAM 0.5 MG PO TABS: 0.5000 mg | ORAL_TABLET | Freq: Every evening | ORAL | Status: DC | PRN

## 2013-09-17 MED ORDER — FLUTICASONE PROPIONATE 50 MCG/ACT NA SUSP: 2.0000 | Freq: Every day | NASAL | Status: DC

## 2013-09-17 NOTE — Progress Notes (Signed)
Pre visit review using our clinic review tool, if applicable. No additional management support is needed unless otherwise documented below in the visit note. 

## 2013-09-17 NOTE — Assessment & Plan Note (Signed)
Occasionally sees few drops of blood with bowel movements, recommend observation, will call if symptoms increase

## 2013-09-17 NOTE — Assessment & Plan Note (Signed)
Since the last year he is using Flonase inconsistently, and despite that he feels somehow better, symptoms are on and off. He wonders if he could use Flonase and  feel even better, I do  think so, prescription provided

## 2013-09-17 NOTE — Assessment & Plan Note (Signed)
Td 09 flu shot today STE Has gain some weight ---> diet, exercise discussed  Labs

## 2013-09-17 NOTE — Assessment & Plan Note (Signed)
Uses alprazolam as needed, prescription provided, UDS today

## 2013-09-17 NOTE — Patient Instructions (Signed)
Get your blood work before you leave  Next visit in 1 year for a physical exam, fasting Please make an appointment

## 2013-09-17 NOTE — Progress Notes (Signed)
   Subjective:    Patient ID: Gregory Novak, male    DOB: 08/06/1981, 33 y.o.   MRN: [id number]  HPI CPX  Past Medical History  Diagnosis Date  . Chest pain     exertional, reports (-) CT angio of the chest  . Allergic rhinitis 08/15/2011  . Insomnia 08/15/2011   Past Surgical History  Procedure Laterality Date  . No past surgeries     History   Social History  . Marital Status: Single    Spouse Name: N/A    Number of Children: 0  . Years of Education: N/A   Occupational History  . printing  Unemployed   Social History Main Topics  . Smoking status: Former Games developermoker  . Smokeless tobacco: Never Used  . Alcohol Use: Yes     Comment: socially   . Drug Use: No  . Sexual Activity: Not on file   Other Topics Concern  . Not on file   Social History Narrative   Married 07-2012, no children   Family History  Problem Relation Age of Onset  . Cancer Father 3650    prostate  . Diabetes Neg Hx   . Coronary artery disease Other     GP  . Cancer Other     lung,skin,breast  . Stroke Neg Hx     Review of Systems Diet, Exercise-- improving  No  CP, SOB  Denies  nausea, vomiting diarrhea  blood in the stools x 2 since last year (w/ BM, in the toilet paper, few drops, red blood), h/o hemorrhoids  (-) cough, sputum production (-) wheezing  No dysuria, gross hematuria, difficulty urinating   No anxiety, depression       Objective:   Physical Exam BP 116/76  Pulse 69  Temp(Src) 98 F (36.7 C)  Ht 5' 9.5" (1.765 m)  Wt 220 lb (99.791 kg)  BMI 32.03 kg/m2  SpO2 96% General -- alert, well-developed, NAD.  Neck --no thyromegaly Lungs -- normal respiratory effort, no intercostal retractions, no accessory muscle use, and normal breath sounds.  Heart-- normal rate, regular rhythm, no murmur.  Abdomen-- Not distended, good bowel sounds,soft, non-tender. Extremities-- no pretibial edema bilaterally  Neurologic--  alert & oriented X3. Speech normal, gait normal,  strength normal in all extremities.  Psych-- Cognition and judgment appear intact. Cooperative with normal attention span and concentration. No anxious or depressed appearing.      Assessment & Plan:

## 2013-09-19 ENCOUNTER — Encounter: Payer: Self-pay | Admitting: Internal Medicine

## 2013-09-20 ENCOUNTER — Encounter: Payer: Self-pay | Admitting: *Deleted

## 2013-10-05 ENCOUNTER — Telehealth: Payer: Self-pay

## 2013-10-05 NOTE — Telephone Encounter (Signed)
UDS: 09/20/2013 Negative Alprazolam as needed Low risk per Dr Drue NovelPaz

## 2013-12-01 ENCOUNTER — Encounter: Payer: Self-pay | Admitting: Internal Medicine

## 2014-08-04 ENCOUNTER — Emergency Department (HOSPITAL_COMMUNITY): Admission: EM | Admit: 2014-08-04 | Discharge: 2014-08-04 | Discharge status: Absent without leave | Payer: 59

## 2014-09-21 ENCOUNTER — Encounter: Payer: 59 | Admitting: Internal Medicine

## 2014-12-13 ENCOUNTER — Telehealth: Payer: Self-pay | Admitting: Internal Medicine

## 2014-12-13 NOTE — Telephone Encounter (Signed)
Pre visit letter sent  °

## 2014-12-14 ENCOUNTER — Other Ambulatory Visit: Payer: Self-pay

## 2015-01-02 ENCOUNTER — Telehealth: Payer: Self-pay | Admitting: *Deleted

## 2015-01-02 ENCOUNTER — Encounter: Payer: Self-pay | Admitting: *Deleted

## 2015-01-02 NOTE — Telephone Encounter (Signed)
Unable to reach patient at time of Pre-Visit Call.  Left message for patient to return call when available.    

## 2015-01-02 NOTE — Addendum Note (Signed)
Addended by: Noreene LarssonLARSON, Yasseen Salls A on: 01/02/2015 03:16 PM   Modules accepted: Medications

## 2015-01-02 NOTE — Telephone Encounter (Signed)
Pre-Visit Call completed with patient and chart updated.   Pre-Visit Info documented in Specialty Comments under [employer].    

## 2015-01-03 ENCOUNTER — Encounter: Payer: Self-pay | Admitting: Internal Medicine

## 2015-01-03 ENCOUNTER — Ambulatory Visit (INDEPENDENT_AMBULATORY_CARE_PROVIDER_SITE_OTHER): Payer: 59 | Admitting: Internal Medicine

## 2015-01-03 VITALS — BP 122/78 | HR 71 | Temp 97.8°F | Resp 16 | Ht 70.0 in | Wt 229.0 lb

## 2015-01-03 DIAGNOSIS — G4700 Insomnia, unspecified: Secondary | ICD-10-CM

## 2015-01-03 DIAGNOSIS — Z0000 Encounter for general adult medical examination without abnormal findings: Secondary | ICD-10-CM | POA: Diagnosis not present

## 2015-01-03 DIAGNOSIS — K648 Other hemorrhoids: Secondary | ICD-10-CM

## 2015-01-03 LAB — CBC WITH DIFFERENTIAL/PLATELET
BASOS ABS: 0 10*3/uL (ref 0.0–0.1)
BASOS PCT: 0.4 % (ref 0.0–3.0)
EOS ABS: 0.1 10*3/uL (ref 0.0–0.7)
Eosinophils Relative: 2.6 % (ref 0.0–5.0)
HCT: 47.1 % (ref 39.0–52.0)
Hemoglobin: 16.6 g/dL (ref 13.0–17.0)
Lymphocytes Relative: 39.4 % (ref 12.0–46.0)
Lymphs Abs: 2.2 10*3/uL (ref 0.7–4.0)
MCHC: 35.3 g/dL (ref 30.0–36.0)
MCV: 91.3 fl (ref 78.0–100.0)
MONO ABS: 0.5 10*3/uL (ref 0.1–1.0)
Monocytes Relative: 9 % (ref 3.0–12.0)
NEUTROS PCT: 48.6 % (ref 43.0–77.0)
Neutro Abs: 2.7 10*3/uL (ref 1.4–7.7)
Platelets: 224 10*3/uL (ref 150.0–400.0)
RBC: 5.16 Mil/uL (ref 4.22–5.81)
RDW: 12.8 % (ref 11.5–15.5)
WBC: 5.5 10*3/uL (ref 4.0–10.5)

## 2015-01-03 LAB — LIPID PANEL
CHOL/HDL RATIO: 3
CHOLESTEROL: 131 mg/dL (ref 0–200)
HDL: 40.8 mg/dL (ref >39.00)
LDL Cholesterol: 62 mg/dL (ref 0–99)
NONHDL: 90.2
Triglycerides: 140 mg/dL (ref 0.0–149.0)
VLDL: 28 mg/dL (ref 0.0–40.0)

## 2015-01-03 LAB — TSH: TSH: 1.4 u[IU]/mL (ref 0.35–4.50)

## 2015-01-03 MED ORDER — EPINEPHRINE 0.3 MG/0.3ML IJ SOAJ: 0.3000 mg | Freq: Once | INTRAMUSCULAR | Status: DC

## 2015-01-03 MED ORDER — ALPRAZOLAM 0.5 MG PO TABS: 0.5000 mg | ORAL_TABLET | Freq: Every evening | ORAL | Status: DC | PRN

## 2015-01-03 NOTE — Progress Notes (Signed)
Pre visit review using our clinic review tool, if applicable. No additional management support is needed unless otherwise documented below in the visit note. 

## 2015-01-03 NOTE — Assessment & Plan Note (Addendum)
Td 09 STE diet, exercise discussed  Labs   CBC, FLP, TSH    Recently had a reaction to naproxen, is already listed in his allergies Request a EpiPen which will be provided for bee  allergies

## 2015-01-03 NOTE — Patient Instructions (Signed)
Get your blood work before you leave    Come back to the office in 1 year  for a physical exam  Please schedule an appointment at the front desk    Come back fasting     Testicular Self-Exam A self-examination of your testicles involves looking at and feeling your testicles for abnormal lumps or swelling. Several things can cause swelling, lumps, or pain in your testicles. Some of these causes are:  Injuries.  Inflammation.  Infection.  Accumulation of fluids around your testicle (hydrocele).  Twisted testicles (testicular torsion).  Testicular cancer. Self-examination of the testicles and groin areas may be advised if you are at risk for testicular cancer. Risks for testicular cancer include:  An undescended testicle (cryptorchidism).  A history of previous testicular cancer.  A family history of testicular cancer. The testicles are easiest to examine after warm baths or showers and are more difficult to examine when you are cold. This is because the muscles attached to the testicles retract and pull them up higher or into the abdomen. Follow these steps while you are standing:  Hold your penis away from your body.  Roll one testicle between your thumb and forefinger, feeling the entire testicle.  Roll the other testicle between your thumb and forefinger, feeling the entire testicle. Feel for lumps, swelling, or discomfort. A normal testicle is egg shaped and feels firm. It is smooth and not tender. The spermatic cord can be felt as a firm spaghetti-like cord at the back of your testicle. It is also important to examine the crease between the front of your leg and your abdomen. Feel for any bumps that are tender. These could be enlarged lymph nodes.  Document Released: 12/02/2000 Document Revised: 04/28/2013 Document Reviewed: 02/15/2013 Exit[employer] Patient Information 2015 [employer]. This information is not intended to replace advice given to you by your health care  provider. Make sure you discuss any questions you have with your health care provider.  

## 2015-01-03 NOTE — Progress Notes (Signed)
Subjective:    Patient ID: Gregory Novak, male    DOB: 09/14/1980, 34 y.o.   MRN: [id number]  DOS:  01/03/2015 Type of visit - description : CPX Interval history:  Patient is a 34 year old male with a history of hemorrhoids, allergic rhinitis, and insomnia in today for his annual physical exam.  He states that his hemorrhoid sx have decreased in frequency and severity, still notices occasional blood spotting in stool but has been following advice to decrease straining during BMs. Denies any constipation or diarrhea.   Patient notes his seasonal allergies have subsided and is no longer taking any medications for it. Notes no eye watering/itching, no congestion, no rhinorrhea, no sore throat.  When asked about Xanax use for insomnia, he notes that he only uses the Xanax for anxiety prior to flying. He says that he has no difficulties falling to sleep or staying asleep.  Patient does mention a recent allergic reaction to naproxen which began with itching on his feet and hands progressing to abdominal hives. He has used naproxen in the past with no issues, this is a new allergic reaction.   Review of Systems  Constitutional: No fever, chills. No unexplained wt changes. No unusual sweats HEENT: No dental problems, ear discharge, facial swelling, voice changes. No eye discharge, redness or intolerance to light Respiratory: No wheezing or difficulty breathing. No cough , mucus production Cardiovascular: No CP, leg swelling or palpitations GI: no nausea, vomiting, diarrhea or abdominal pain.  No blood in the stools. No dysphagia   Endocrine: No polyphagia, polyuria or polydipsia GU: No dysuria, gross hematuria, difficulty urinating. No urinary urgency or frequency. Musculoskeletal: No joint swellings or unusual aches or pains Skin: No change in the color of the skin, palor or rash Allergic, immunologic: No environmental allergies or food allergies Neurological: No dizziness or syncope.  No headaches. No diplopia, slurred speech, motor deficits, facial numbness Hematological: No enlarged lymph nodes, easy bruising or bleeding Psychiatry: No suicidal ideas, hallucinations, behavior problems or confusion. No unusual/severe anxiety or depression.     Past Medical History  Diagnosis Date  . Chest pain     exertional, reports (-) CT angio of the chest  . Allergic rhinitis 08/15/2011  . Insomnia 08/15/2011    Past Surgical History  Procedure Laterality Date  . No past surgeries      History   Social History  . Marital Status: Single    Spouse Name: N/A  . Number of Children: 0  . 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 Smoking status: Former Games developermoker  . Smokeless tobacco: Never Used  . Alcohol Use: Yes     Comment: socially   . Drug Use: No  . Sexual Activity: Not on file   Other Topics Concern  . Not on file   Social History Narrative   Married 07-2012, no children     Family History  Problem Relation Age of Onset  . Cancer Father 7650    prostate  . Diabetes Neg Hx   . Coronary artery disease Other     GP  . Cancer Other     lung,skin,breast  . Stroke Neg Hx   . Colon cancer Neg Hx        Medication List       This list is accurate as of: 01/03/15  8:28 AM.  Always use your most recent  med list.               ALPRAZolam 0.5 MG tablet  Commonly known as:  XANAX  Take 1 tablet (0.5 mg total) by mouth at bedtime as needed.     fluticasone 50 MCG/ACT nasal spray  Commonly known as:  FLONASE  Place 2 sprays into both nostrils daily.           Objective:   Physical Exam BP 122/78 mmHg  Pulse 71  Temp(Src) 97.8 F (36.6 C) (Oral)  Resp 16  Ht  (1.778 m)  Wt 229 lb (103.874 kg)  BMI 32.86 kg/m2  SpO2 97%  General:   Well developed, well nourished . NAD.  Neck:  Full range of motion. Supple. No  thyromegaly , normal carotid pulse HEENT:  Normocephalic .  Face symmetric, atraumatic Lungs:  CTA B Normal respiratory effort, no intercostal retractions, no accessory muscle use. Heart: RRR,  no murmur. Distal pulses intact. Abdomen:  Not distended, soft, non-tender. No rebound or rigidity. No mass,organomegaly Muscle skeletal: no pretibial edema bilaterally  Skin: Exposed areas without rash. Not pale. Not jaundice Neurologic:  alert & oriented X3.  Speech normal, gait appropriate for age and unassisted Strength symmetric and appropriate for age.  Psych: Cognition and judgment appear intact.  Cooperative with normal attention span and concentration.  Behavior appropriate. No anxious or depressed appearing.       Assessment & Plan:

## 2015-01-03 NOTE — Assessment & Plan Note (Signed)
Internal Hemorrhoids:  Patient notes great improvement with symptoms.  Will monitor, patient instructed to inform us if symptoms worsen.

## 2015-01-03 NOTE — Assessment & Plan Note (Signed)
Insomnia: Patient states no difficulties sleeping, Xanax use is primarily for anxiety before flying.

## 2015-04-28 ENCOUNTER — Ambulatory Visit (INDEPENDENT_AMBULATORY_CARE_PROVIDER_SITE_OTHER): Payer: 59 | Admitting: Internal Medicine

## 2015-04-28 ENCOUNTER — Encounter: Payer: Self-pay | Admitting: Internal Medicine

## 2015-04-28 ENCOUNTER — Telehealth: Payer: Self-pay | Admitting: *Deleted

## 2015-04-28 ENCOUNTER — Ambulatory Visit (HOSPITAL_BASED_OUTPATIENT_CLINIC_OR_DEPARTMENT_OTHER)
Admission: RE | Admit: 2015-04-28 | Discharge: 2015-04-28 | Disposition: A | Discharge status: Home / Self Care | Payer: 59 | Source: Ambulatory Visit | Attending: Internal Medicine | Admitting: Internal Medicine

## 2015-04-28 VITALS — BP 140/74 | HR 74 | Temp 98.1°F | Ht 70.0 in | Wt 229.4 lb

## 2015-04-28 DIAGNOSIS — R112 Nausea with vomiting, unspecified: Secondary | ICD-10-CM | POA: Insufficient documentation

## 2015-04-28 DIAGNOSIS — R200 Anesthesia of skin: Secondary | ICD-10-CM

## 2015-04-28 DIAGNOSIS — R51 Headache: Secondary | ICD-10-CM | POA: Diagnosis not present

## 2015-04-28 DIAGNOSIS — R519 Headache, unspecified: Secondary | ICD-10-CM

## 2015-04-28 MED ORDER — HYDROCODONE-ACETAMINOPHEN 5-325 MG PO TABS: 1.0000 | ORAL_TABLET | Freq: Three times a day (TID) | ORAL | Status: DC | PRN

## 2015-04-28 MED ORDER — ONDANSETRON HCL 4 MG PO TABS: 4.0000 mg | ORAL_TABLET | Freq: Three times a day (TID) | ORAL | Status: DC | PRN

## 2015-04-28 NOTE — Patient Instructions (Signed)
We are scheduling a CT of the head today  Take Vicodin and Zofran as needed for headache or nausea. Medications may cause drowsiness.  If you have more symptoms: Call the office  If your symptoms are severe, you have facial weakness, strength deficits, double vision, slurred speech or the symptoms last more than 5 minutes: Call 911

## 2015-04-28 NOTE — Progress Notes (Signed)
Subjective:    Patient ID: Gregory Novak, male    DOB: 09-14-1980, 34 y.o.   MRN: [id number]  DOS:  04/28/2015 Type of visit - description : acute visit, here with his wife.  Interval history: last night, he was watching TV at home and suddenly developed numbness at the left leg,it spread to the left abdomen, shoulder, chest, head and left arm. His wife witnessed the episode, there was no motor deficits, no droopy face, he did have some left hand incoordination. Symptoms resolved within 15 minutes but they were follow-up by a severe right-sided headache, it was the worst of his life, there was associated nausea and he vomited one time. The numbness has not come back but he still has mild to moderate lingering headache with mild nausea. No further vomiting.   Review of Systems No fever, chills. No chest pain or palpitations No neck stiffness or a rash Not taking any new medications. No head injury. Some increased stress at work but no anxiety or depression. No seizure activity, bladder or bowel incontinence, diplopia or slurred speech. He did have a mild headache 2 weeks ago.  Past Medical History  Diagnosis Date  . Chest pain     exertional, reports (-) CT angio of the chest  . Allergic rhinitis 08/15/2011  . Insomnia 08/15/2011    Past Surgical History  Procedure Laterality Date  . No past surgeries      Social History   Social History  . Marital Status: Married    Spouse Name: N/A  . Number of Children: 1  . 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 Smoking status: Former Games developer  . Smokeless tobacco: Never Used  . Alcohol Use: Yes     Comment: socially   . Drug Use: No  . Sexual Activity: Not on file   Other Topics Concern  . Not on file   Social History Narrative   Married 07-2012, 1 child         Medication List       This list is accurate as of: 04/28/15 11:59 PM.  Always use your  most recent med list.               ALPRAZolam 0.5 MG tablet  Commonly known as:  XANAX  Take 1 tablet (0.5 mg total) by mouth at bedtime as needed.     EPINEPHrine 0.3 mg/0.3 mL Soaj injection  Commonly known as:  EPIPEN 2-PAK  Inject 0.3 mLs (0.3 mg total) into the muscle once.     HYDROcodone-acetaminophen 5-325 MG per tablet  Commonly known as:  NORCO/VICODIN  Take 1-2 tablets by mouth every 8 (eight) hours as needed.     ondansetron 4 MG tablet  Commonly known as:  ZOFRAN  Take 1 tablet (4 mg total) by mouth every 8 (eight) hours as needed for nausea or vomiting.           Objective:   Physical Exam BP 140/74 mmHg  Pulse 74  Temp(Src) 98.1 F (36.7 C) (Oral)  Ht 5\' 10"  (1.778 m)  Wt 229 lb 6 oz (104.044 kg)  BMI 32.91 kg/m2  SpO2 96% General:   Well developed, well nourished . NAD.  HEENT:  Normocephalic . Face symmetric, atraumatic Neck: Normal carotid arteries Lungs:  CTA B Normal respiratory effort, no intercostal retractions, no accessory muscle use. Heart: RRR,  no murmur.  No pretibial edema bilaterally  Skin: Not pale. Not jaundice Neurologic:  alert & oriented X3.  Speech normal, gait appropriate for age and unassisted. EOMI, face and motor symmetric, DTRs symmetric. Psych--  Cognition and judgment appear intact.  Cooperative with normal attention span and concentration.  Behavior appropriate. No anxious or depressed appearing.       Assessment & Plan:

## 2015-04-28 NOTE — Telephone Encounter (Signed)
Head CT normal- Dr. Drue Novel and patient notified.

## 2015-04-28 NOTE — Telephone Encounter (Signed)
Called patient to assess symptoms, patient has appointment for headache and numbness on one side of body.  Unable to reach patient at time of call and appointment is scheduled for one hour away.

## 2015-04-28 NOTE — Progress Notes (Signed)
Pre visit review using our clinic review tool, if applicable. No additional management support is needed unless otherwise documented below in the visit note. 

## 2015-04-30 DIAGNOSIS — R51 Headache: Secondary | ICD-10-CM

## 2015-04-30 DIAGNOSIS — R519 Headache, unspecified: Secondary | ICD-10-CM | POA: Insufficient documentation

## 2015-04-30 NOTE — Assessment & Plan Note (Signed)
Episode of left sided numbness follow-up by the worse headache of her life associated with some nausea. DDX: first Migraine episode, TIA, others (seizure? ICB?). Neurological exam is normal today. Plan:  stat CT head Treat headache conservatively with low dose of Vicodin and Zofran. Refer to neurology call anytime if sx resurface;  ER if symptoms severe, stroke like symptoms, sx persistent

## 2015-05-05 ENCOUNTER — Encounter: Payer: Self-pay | Admitting: Neurology

## 2015-05-05 ENCOUNTER — Ambulatory Visit (INDEPENDENT_AMBULATORY_CARE_PROVIDER_SITE_OTHER): Payer: 59 | Admitting: Neurology

## 2015-05-05 VITALS — BP 108/70 | HR 76 | Ht 69.0 in | Wt 228.0 lb

## 2015-05-05 DIAGNOSIS — G43909 Migraine, unspecified, not intractable, without status migrainosus: Secondary | ICD-10-CM | POA: Diagnosis not present

## 2015-05-05 DIAGNOSIS — R202 Paresthesia of skin: Secondary | ICD-10-CM | POA: Diagnosis not present

## 2015-05-05 DIAGNOSIS — G43109 Migraine with aura, not intractable, without status migrainosus: Secondary | ICD-10-CM

## 2015-05-05 NOTE — Patient Instructions (Signed)
1. We have scheduled you at [facility] for your MRI/MRA on 05/11/2015 at 4:00 pm. Please arrive 15 minutes prior and go to 1st floor radiology. If you need to reschedule for any reason please call [phone].

## 2015-05-05 NOTE — Progress Notes (Signed)
Gregory Novak was seen today in neurologic consultation at the request of Willow Ora, MD.  Previous records were reviewed.  On 04/27/2015 the patient was at home watching television with his wife in the evening.  He suddenly developed numbness in the left leg, followed by numbness in the left abdomen, shoulder, chest, arm and head.  It took about 5 minutes to migrate up the L side of the body. He went and took a shower and states that neither of the arms felt well coordinated but they weren't weak.    The paresthesias resolved within about 20 minutes; he then tried to fall asleep.  He wasn't sure if he fell asleep or not, but not long thereafter he had a severe right-sided headache.  This was despite far the worst headache he has ever had.  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  He turned on the light and sat on the side of the bed.  The headache was accompanied by nausea and one episode of emesis (tiny bit).  Headache is described as stabbing in nature.  No autonomic sx's.  He got some sleep and when he woke up in the AM, he just had a nagging pain.  He went to the doctor in the AM and got some vicodin and he took a total of 3 tablets that day and it went away totally.  He never had to take the nausea medication.  The patient did have CT of the brain on 04/28/2015 that I did have the opportunity to review.  This was unremarkable.  No headaches since.  No new medications or supplements.   ALLERGIES:   Allergies  Allergen Reactions  . Naproxen Sodium Hives    Itchy hands-feet-hives   . Bee Venom Hives    CURRENT MEDICATIONS:  Outpatient Encounter Prescriptions as of 05/05/2015  Medication Sig  . HYDROcodone-acetaminophen (NORCO/VICODIN) 5-325 MG per tablet Take 1-2 tablets by mouth every 8 (eight) hours as needed.  Marland Kitchen ibuprofen (ADVIL,MOTRIN) 600 MG tablet Take 600 mg by mouth 2 (two) times a week.  Marland Kitchen EPINEPHrine (EPIPEN 2-PAK) 0.3 mg/0.3 mL IJ SOAJ injection Inject 0.3 mLs (0.3 mg total) into the muscle once.  (Patient not taking: Reported on 04/28/2015)  . [DISCONTINUED] ALPRAZolam (XANAX) 0.5 MG tablet Take 1 tablet (0.5 mg total) by mouth at bedtime as needed. (Patient not taking: Reported on 04/28/2015)  . [DISCONTINUED] ondansetron (ZOFRAN) 4 MG tablet Take 1 tablet (4 mg total) by mouth every 8 (eight) hours as needed for nausea or vomiting.   No facility-administered encounter medications on file as of 05/05/2015.    PAST MEDICAL HISTORY:   No past medical history on file.  PAST SURGICAL HISTORY:   Past Surgical History  Procedure Laterality Date  . Wisdom tooth extraction      SOCIAL HISTORY:   Social History   Social History  . Marital Status: Married    Spouse Name: N/A  . Number of Children: 1  . 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 Smoking status: Never Smoker   . Smokeless tobacco: Never Used  . Alcohol Use: 0.0 oz/week    0 Standard drinks or equivalent per week     Comment: 4-5 times weekly  . Drug Use: No  . Sexual Activity: Not on file   Other Topics Concern  . Not on file   Social History Narrative   Married 07-2012, 1  child     FAMILY HISTORY:   Family Status  Relation Status Death Age  . Father Alive     prostate cancer  . Mother Alive     healthy  . Sister Alive     healthy  . Son Alive     healthy    ROS:  A complete 10 system review of systems was obtained and was unremarkable apart from what is mentioned above.  PHYSICAL EXAMINATION:    VITALS:   Filed Vitals:   05/05/15 0757  BP: 108/70  Pulse: 76  Height:  (1.753 m)  Weight: 228 lb (103.42 kg)    GEN:  Normal appears male in no acute distress.  Appears stated age. HEENT:  Normocephalic, atraumatic. The mucous membranes are moist. The superficial temporal arteries are without ropiness or tenderness. Cardiovascular: Regular rate and rhythm. Lungs: Clear to auscultation bilaterally. Neck/Heme: There are no  carotid bruits noted bilaterally.  NEUROLOGICAL: Orientation:  The patient is alert and oriented x 3.  Fund of knowledge is appropriate.  Recent and remote memory intact.  Attention span and concentration normal.  Repeats and names without difficulty. Cranial nerves: There is good facial symmetry. The pupils are equal round and reactive to light bilaterally. Fundoscopic exam reveals clear disc margins bilaterally. Extraocular muscles are intact and visual fields are full to confrontational testing. Speech is fluent and clear. Soft palate rises symmetrically and there is no tongue deviation. Hearing is intact to conversational tone. Tone: Tone is good throughout. Sensation: Sensation is intact to light touch and pinprick throughout (facial, trunk, extremities). Vibration is intact at the bilateral big toe. There is no extinction with double simultaneous stimulation. There is no sensory dermatomal level identified. Coordination:  The patient has no difficulty with RAM's or FNF bilaterally. Motor: Strength is 5/5 in the bilateral upper and lower extremities.  Shoulder shrug is equal and symmetric. There is no pronator drift.  There are no fasciculations noted. DTR's: Deep tendon reflexes are 2/4 at the bilateral biceps, triceps, brachioradialis, patella and achilles.  Plantar responses are downgoing bilaterally. Gait and Station: The patient is able to ambulate without difficulty. The patient is able to heel toe walk without any difficulty. The patient is able to ambulate in a tandem fashion. The patient is able to stand in the Romberg position.   IMPRESSION/PLAN 1.  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  We should do an MRI of the brain and MRA of the brain.  -Talked about the fact that complex migraine may slightly increase ones risk for stroke.  -If has another event, try ibuprofen 800 mg at onset along with the  zofran that he has.  -will call him with the results of the above after completed  -if above normal, can follow up prn.  -Much greater than 50% of this visit was spent in counseling with the patient and the family.  Total face to face time:  

## 2015-05-11 ENCOUNTER — Ambulatory Visit (HOSPITAL_COMMUNITY)
Admission: RE | Admit: 2015-05-11 | Discharge: 2015-05-11 | Disposition: A | Discharge status: Home / Self Care | Payer: 59 | Source: Ambulatory Visit | Attending: Neurology | Admitting: Neurology

## 2015-05-11 ENCOUNTER — Ambulatory Visit (HOSPITAL_COMMUNITY): Payer: 59

## 2015-05-11 DIAGNOSIS — G43109 Migraine with aura, not intractable, without status migrainosus: Secondary | ICD-10-CM | POA: Insufficient documentation

## 2015-05-11 DIAGNOSIS — R200 Anesthesia of skin: Secondary | ICD-10-CM | POA: Insufficient documentation

## 2015-05-11 DIAGNOSIS — R202 Paresthesia of skin: Secondary | ICD-10-CM

## 2015-05-11 MED ORDER — GADOBENATE DIMEGLUMINE 529 MG/ML IV SOLN
20.0000 mL | Freq: Once | INTRAVENOUS | Status: AC | PRN
  Administered 2015-05-11: 20 mL via INTRAVENOUS

## 2015-05-11 MED ORDER — GADOBENATE DIMEGLUMINE 529 MG/ML IV SOLN: 20.0000 mL | Freq: Once | INTRAVENOUS | Status: DC | PRN

## 2015-05-12 ENCOUNTER — Telehealth: Payer: Self-pay | Admitting: Neurology

## 2015-05-12 NOTE — Telephone Encounter (Signed)
-----   Message from Octaviano Batty Tat, DO sent at 05/12/2015  8:06 AM EDT ----- Let pt know that I reviewed MRI brain/MRA brain and they are normal

## 2015-05-12 NOTE — Telephone Encounter (Signed)
Left message on machine for patient to call back.

## 2015-05-12 NOTE — Telephone Encounter (Signed)
Pt called for MRI & MRA results/ call back @ [phone]

## 2015-05-12 NOTE — Telephone Encounter (Signed)
Patient made aware results normal.  

## 2016-01-08 ENCOUNTER — Encounter: Payer: Self-pay | Admitting: *Deleted

## 2016-01-08 ENCOUNTER — Ambulatory Visit (INDEPENDENT_AMBULATORY_CARE_PROVIDER_SITE_OTHER): Payer: 59 | Admitting: Internal Medicine

## 2016-01-08 ENCOUNTER — Encounter: Payer: Self-pay | Admitting: Internal Medicine

## 2016-01-08 VITALS — BP 116/78 | HR 75 | Temp 97.7°F | Resp 16 | Wt 237.0 lb

## 2016-01-08 DIAGNOSIS — Z09 Encounter for follow-up examination after completed treatment for conditions other than malignant neoplasm: Secondary | ICD-10-CM

## 2016-01-08 DIAGNOSIS — Z0000 Encounter for general adult medical examination without abnormal findings: Secondary | ICD-10-CM

## 2016-01-08 LAB — BASIC METABOLIC PANEL
BUN: 13 mg/dL (ref 6–23)
CALCIUM: 9.2 mg/dL (ref 8.4–10.5)
CO2: 29 meq/L (ref 19–32)
CREATININE: 1.02 mg/dL (ref 0.40–1.50)
Chloride: 102 mEq/L (ref 96–112)
GFR: 89.32 mL/min (ref >60.00)
GLUCOSE: 97 mg/dL (ref 70–99)
Potassium: 4.2 mEq/L (ref 3.5–5.1)
Sodium: 139 mEq/L (ref 135–145)

## 2016-01-08 MED ORDER — EPINEPHRINE 0.3 MG/0.3ML IJ SOAJ: 0.3000 mg | Freq: Once | INTRAMUSCULAR | Status: DC

## 2016-01-08 MED ORDER — FLUTICASONE PROPIONATE 50 MCG/ACT NA SUSP: 2.0000 | Freq: Every day | NASAL | Status: DC

## 2016-01-08 NOTE — Assessment & Plan Note (Signed)
Allergies: Refill EpiPen and flonase Lingering cough after URI, exam normal, Flonase and Mucinex as needed, call if not better RTC 1 year

## 2016-01-08 NOTE — Assessment & Plan Note (Addendum)
Td 09 Diet and exercise discussed  Labs BMP

## 2016-01-08 NOTE — Patient Instructions (Signed)
GO TO THE LAB :      Get the blood work     GO TO THE FRONT DESK Schedule your next appointment for a  Physical in 1 year, fasting   flonase 2 sprays daily mucinex DM as needed for cough Call if no better soon

## 2016-01-08 NOTE — Progress Notes (Signed)
Pre visit review using our clinic review tool, if applicable. No additional management support is needed unless otherwise documented below in the visit note. 

## 2016-01-08 NOTE — Progress Notes (Signed)
Subjective:    Patient ID: Gregory Novak, male    DOB: [birth year], 35 y.o.   MRN: [id number]  DOS:  01/08/2016 Type of visit - description : CPX Interval history: feeling well, no major concerns    Review of Systems  Constitutional: No fever. No chills. No unexplained wt changes. No unusual sweats  HEENT: No dental problems, no ear discharge, no facial swelling, no voice changes. No eye discharge, no eye  redness , no  intolerance to light   Respiratory: No wheezing , no  difficulty breathing. Had a URI a couple weeks ago, better, has a mild lingering cough but no fever chills or postnasal dripping  Cardiovascular: No CP, no leg swelling , no  Palpitations  GI: no nausea, no vomiting, no diarrhea , no  abdominal pain.  History of hemorrhoids, occasionally has few drops of blood in the toilet paper No dysphagia, no odynophagia    Endocrine: No polyphagia, no polyuria , no polydipsia  GU: No dysuria, gross hematuria, difficulty urinating. No urinary urgency, no frequency.  Musculoskeletal: No joint swellings or unusual aches or pains  Skin: No change in the color of the skin, palor , no  Rash  Allergic, immunologic: No environmental allergies , no  food allergies  Neurological: No dizziness no  syncope. No headaches. No diplopia, no slurred, no slurred speech, no motor deficits, no facial  Numbness  Hematological: No enlarged lymph nodes, no easy bruising , no unusual bleedings  Psychiatry: No suicidal ideas, no hallucinations, no beavior problems, no confusion.  No unusual/severe anxiety, no depression  History reviewed. No pertinent past medical history.  Past Surgical History  Procedure Laterality Date  . Wisdom tooth extraction      Social History   Social History  . Marital Status: Married    Spouse Name: N/A  . Number of Children: 1  . 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 Smoking  status: Never Smoker   . Smokeless tobacco: Never Used  . Alcohol Use: 0.0 oz/week    0 Standard drinks or equivalent per week     Comment: 4-5 times weekly  . Drug Use: No  . Sexual Activity: Not on file   Other Topics Concern  . Not on file   Social History Narrative   Married 07-2012, 1 son , wife pregnant     Family History  Problem Relation Age of Onset  . Cancer Father 44    prostate  . Diabetes Neg Hx   . Coronary artery disease Other     GP  . Cancer Other     lung,skin,breast  . Stroke Neg Hx   . Colon cancer Neg Hx        Medication List       This list is accurate as of: 01/08/16 10:29 AM.  Always use your most recent med list.               EPINEPHrine 0.3 mg/0.3 mL Soaj injection  Commonly known as:  EPIPEN 2-PAK  Inject 0.3 mLs (0.3 mg total) into the muscle once.     fluticasone 50 MCG/ACT nasal spray  Commonly known as:  FLONASE  Place 2 sprays into both nostrils daily.           Objective:   Physical Exam BP 116/78 mmHg  Pulse 75  Temp(Src) 97.7 F (36.5 C) (Oral)  Resp  16  Wt 237 lb (107.502 kg)  SpO2 98%  General:   Well developed, well nourished . NAD.  Neck: No  Thyromegaly HEENT:  Normocephalic . Face symmetric, atraumatic. TMs, throat: Normal Lungs:  CTA B Normal respiratory effort, no intercostal retractions, no accessory muscle use. Heart: RRR,  no murmur.  No pretibial edema bilaterally  Abdomen:  Not distended, soft, non-tender. No rebound or rigidity.   Skin: Exposed areas without rash. Not pale. Not jaundice Neurologic:  alert & oriented X3.  Speech normal, gait appropriate for age and unassisted Strength symmetric and appropriate for age.  Psych: Cognition and judgment appear intact.  Cooperative with normal attention span and concentration.  Behavior appropriate. No anxious or depressed appearing.    Assessment & Plan:   Assessment Complex migraine, s/p  neurology eval 04-2015, MRI/MRA 05-2015  (-) Insomnia, fear of flying, Xanax as needed Allergic rhinitis Hemorrhoids Epipen mostly for bees reaction (hives, sever rash), also has a  Naproxen allergy (gen rash)  Plan: Allergies: Refill EpiPen and flonase Lingering cough after URI, exam normal, Flonase and Mucinex as needed, call if not better RTC 1 year

## 2017-03-18 IMAGING — CT CT HEAD W/O CM
1 series · 16 of 30 positions shown, 20 images · non-contrast
Comparison: None.

CLINICAL DATA: Acute onset of left-sided numbness. Headache with
nausea and vomiting. Right-sided headache.

EXAM:
CT HEAD WITHOUT CONTRAST
TECHNIQUE: Contiguous axial images were obtained from the base of the skull
through the vertex without intravenous contrast.

[Series 2: head 4.8 h37s · axial · 0.45mm/px · z∈[-140,-3]mm · 16 of 32 slices shown, 20 images]
[im 2/32  brain]
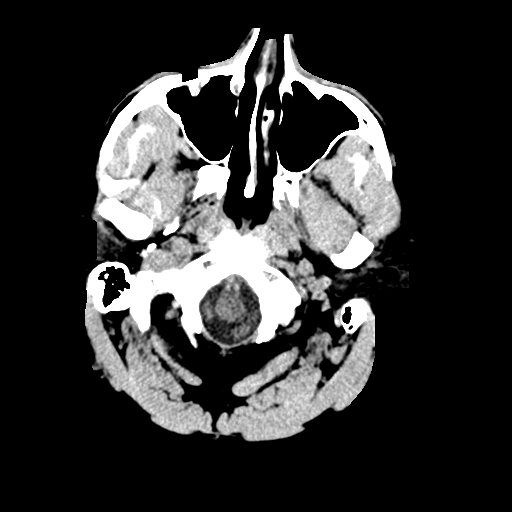
[im 2/32  bone]
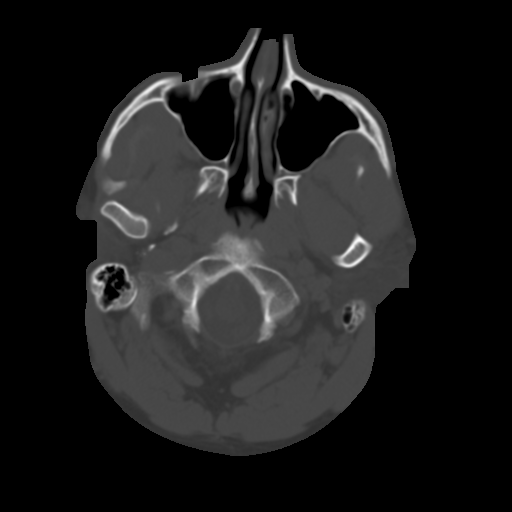
[im 4/32  brain]
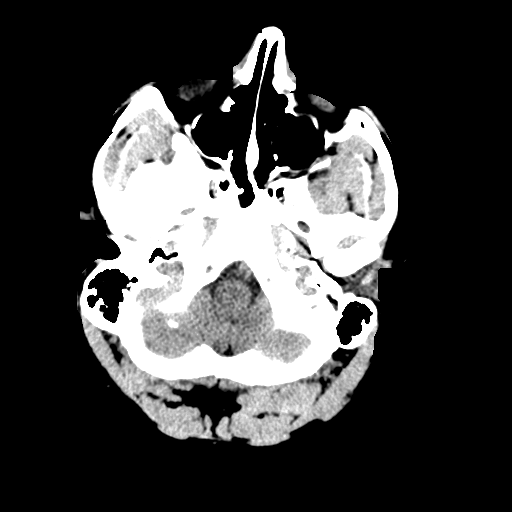
[im 6/32  brain]
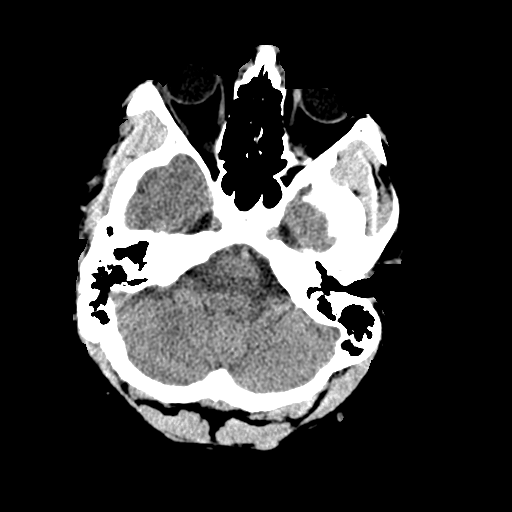
[im 8/32  brain]
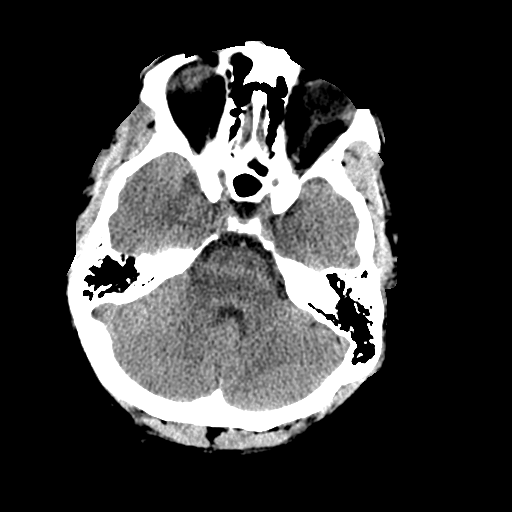
[im 9/32  brain]
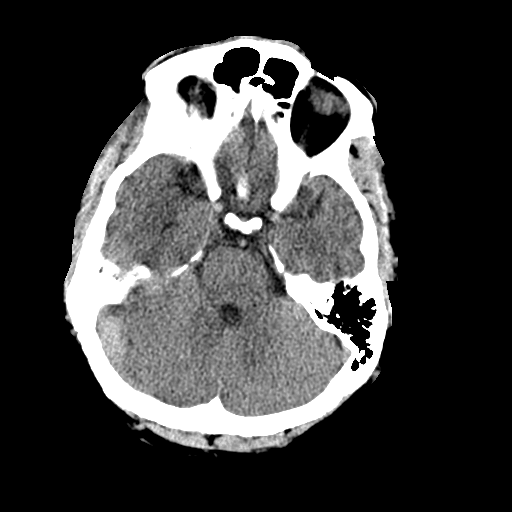
[im 9/32  bone]
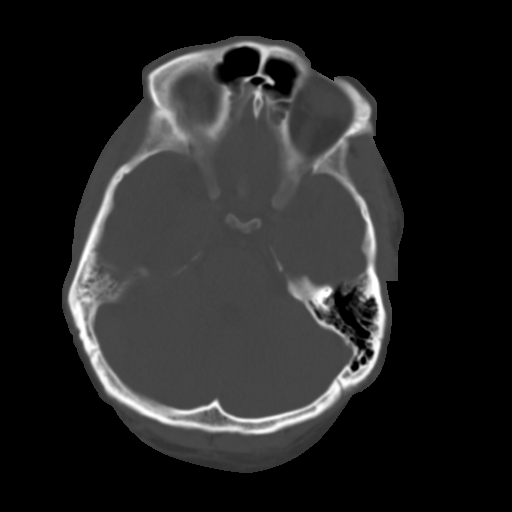
[im 11/32  brain]
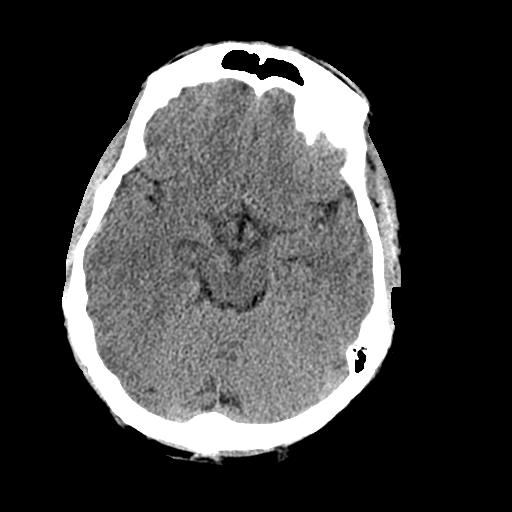
[im 13/32  brain]
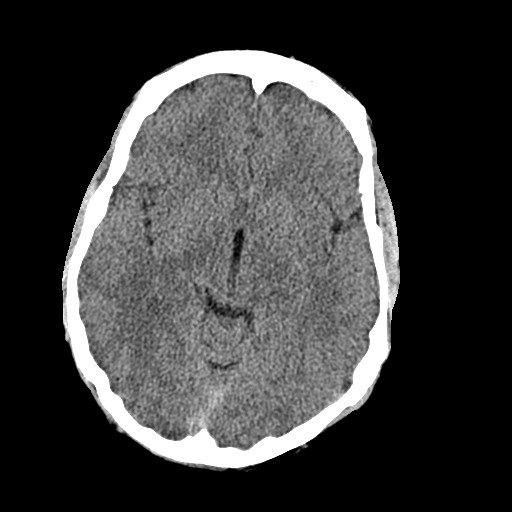
[im 15/32  brain]
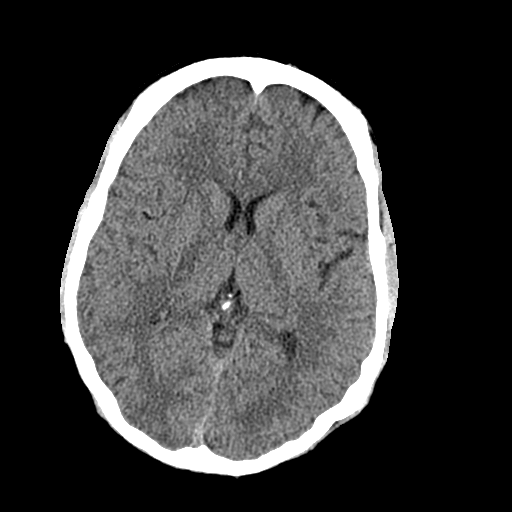
[im 17/32  brain]
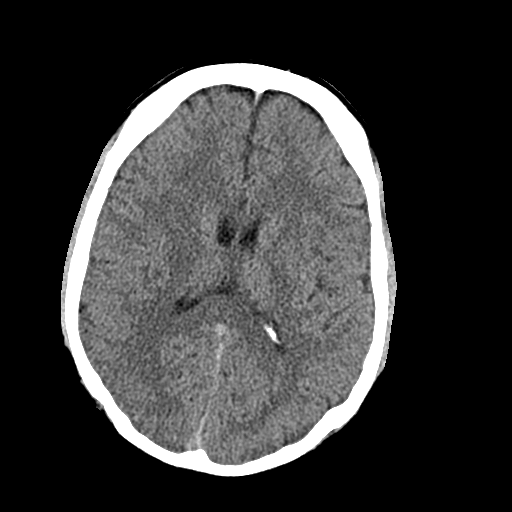
[im 17/32  bone]
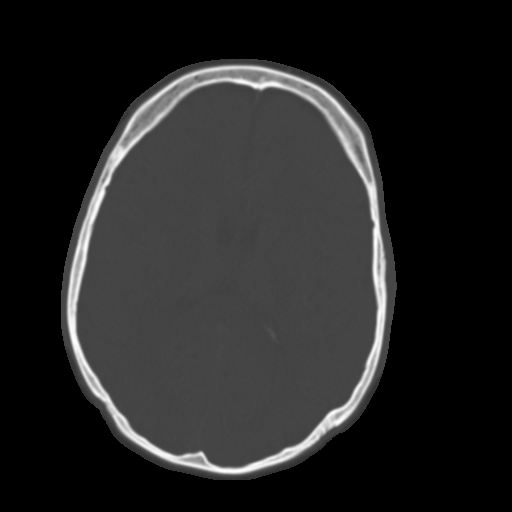
[im 19/32  brain]
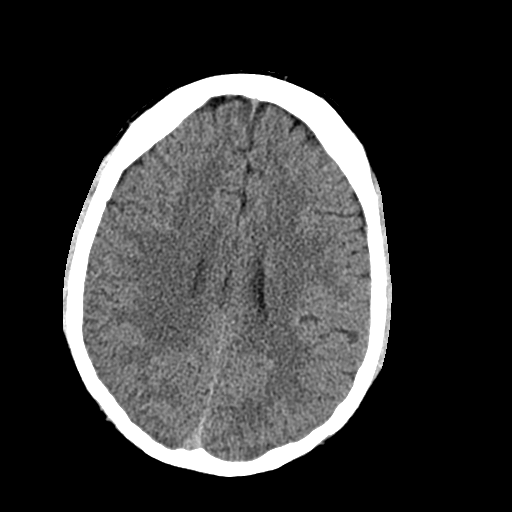
[im 21/32  brain]
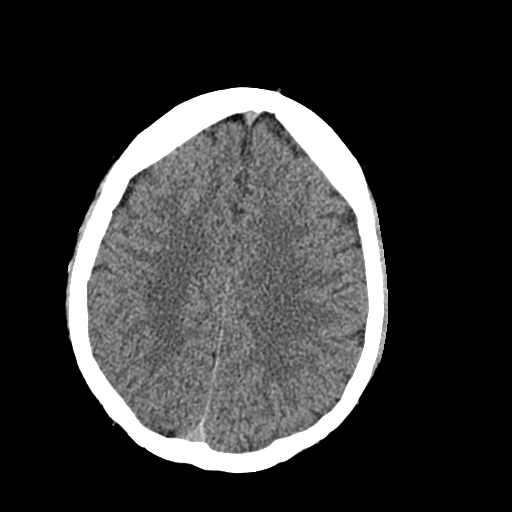
[im 23/32  brain]
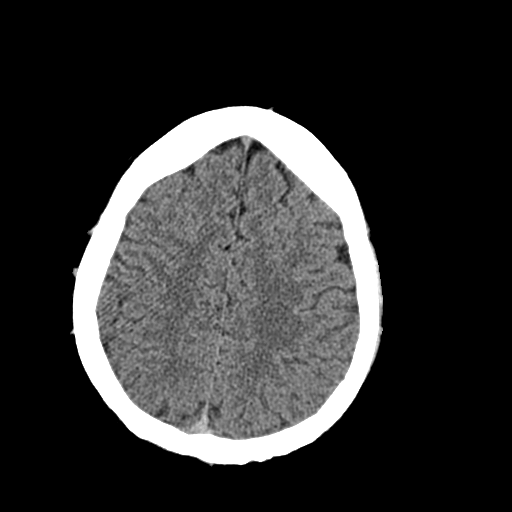
[im 24/32  brain]
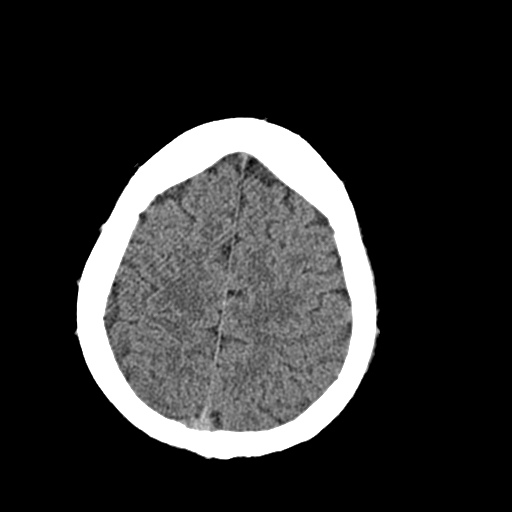
[im 24/32  bone]
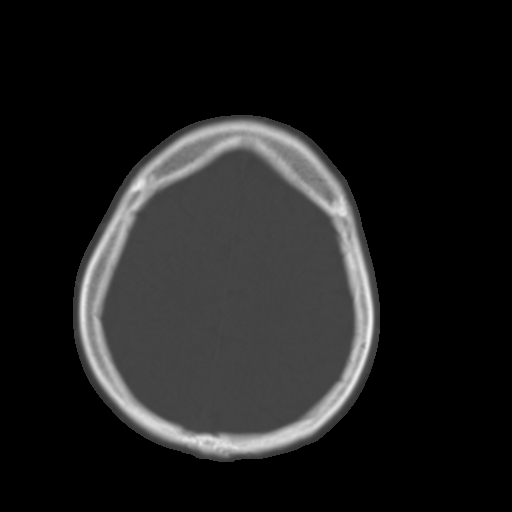
[im 26/32  brain]
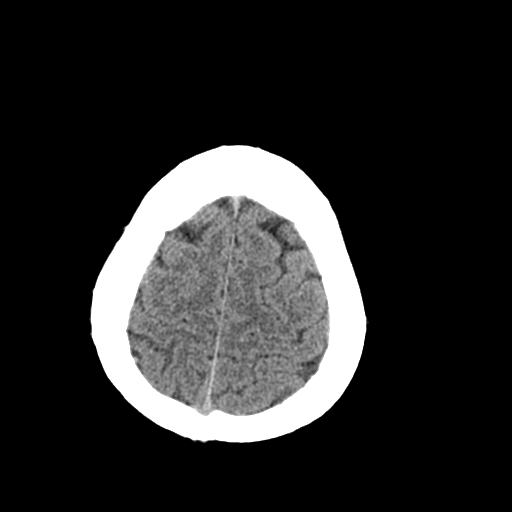
[im 28/32  brain]
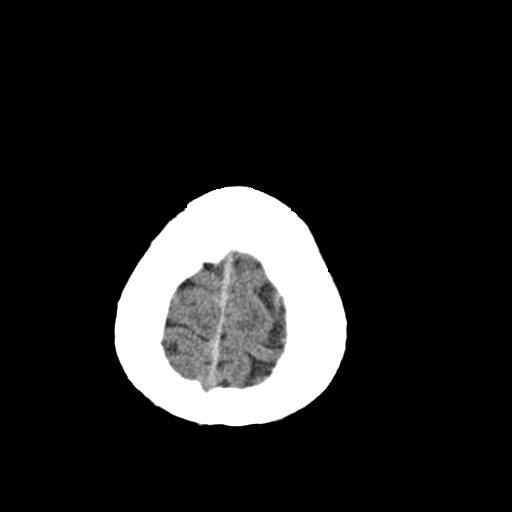
[im 30/32  brain]
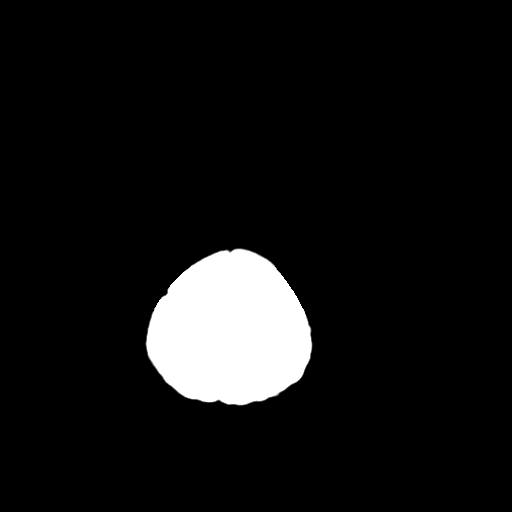

[16 of 30 positions shown; findings below may reference images not displayed]

FINDINGS: No mass lesion. No midline shift. No acute hemorrhage or hematoma.
No extra-axial fluid collections. No evidence of acute infarction.
Brain parenchyma is normal. Osseous structures are normal.
IMPRESSION: Normal exam.

## 2017-10-07 DIAGNOSIS — J342 Deviated nasal septum: Secondary | ICD-10-CM | POA: Insufficient documentation

## 2017-10-07 DIAGNOSIS — J329 Chronic sinusitis, unspecified: Secondary | ICD-10-CM | POA: Diagnosis not present

## 2017-10-07 DIAGNOSIS — J310 Chronic rhinitis: Secondary | ICD-10-CM | POA: Diagnosis not present

## 2017-11-19 ENCOUNTER — Telehealth: Payer: Self-pay

## 2017-11-19 NOTE — Telephone Encounter (Signed)
Please advise 

## 2017-11-19 NOTE — Telephone Encounter (Signed)
Ok w/ me 

## 2017-11-19 NOTE — Telephone Encounter (Signed)
Okay with me 

## 2017-11-19 NOTE — Telephone Encounter (Signed)
Copied from CRM [id number]. Topic: General - Other >> Nov 19, 2017 12:01 PM Arlyss Gandyichardson, Taren N, NT wrote: Reason for CRM: Pt would like to transfer care from Dr. Drue NovelPaz to [facility] at [facility].

## 2017-11-26 NOTE — Telephone Encounter (Signed)
Appt w/ Dr. Dayton MartesAron on 03/10/2018.

## 2018-03-05 ENCOUNTER — Ambulatory Visit: Payer: 59 | Admitting: Family Medicine

## 2018-03-09 ENCOUNTER — Other Ambulatory Visit: Payer: Self-pay

## 2018-03-10 ENCOUNTER — Encounter: Payer: Self-pay | Admitting: Family Medicine

## 2018-03-10 ENCOUNTER — Ambulatory Visit: Payer: 59 | Admitting: Family Medicine

## 2018-03-10 VITALS — BP 130/86 | HR 78 | Temp 98.6°F | Ht 69.75 in | Wt 232.2 lb

## 2018-03-10 DIAGNOSIS — G4709 Other insomnia: Secondary | ICD-10-CM | POA: Diagnosis not present

## 2018-03-10 DIAGNOSIS — J309 Allergic rhinitis, unspecified: Secondary | ICD-10-CM

## 2018-03-10 DIAGNOSIS — J342 Deviated nasal septum: Secondary | ICD-10-CM | POA: Diagnosis not present

## 2018-03-10 MED ORDER — EPINEPHRINE 0.3 MG/0.3ML IJ SOAJ
0.3000 mg | Freq: Once | INTRAMUSCULAR | 2 refills | Status: AC
Start: 2018-03-10 — End: 2018-03-10

## 2018-03-10 MED ORDER — ALPRAZOLAM 0.5 MG PO TABS: ORAL_TABLET | ORAL | 2 refills | Status: DC

## 2018-03-10 NOTE — Assessment & Plan Note (Signed)
He would like a second opinion in terms of which type of surgery- deviated septum repair and turbinate reduction? Referral placed- [facility]

## 2018-03-10 NOTE — Progress Notes (Signed)
Subjective:   Patient ID: Gregory Novak, male    DOB: Jan 21, 1981, 37 y.o.   MRN: [id number]  Gregory Novak is a pleasant 37 y.o. year old male who presents to clinic today with New Patient (Initial Visit) (Patient is here today to establish care.  He is not currently fasting.  UTD on immunizations.  Declines HIV screening.  He went to [facility] for Deviated Septum but never followed through with it.  He was wanting a 2nd opionion for this.  He states that his Epi-Pen is out of date and he is allergic to Bees.  He is going to be scheduling a trip to fly and would like to end up getting a small script of xanax for the times he does fly.)  on 03/10/2018  HPI:  Patient is here today to establish care. He is not currently fasting. UTD on immunizations. Declines HIV screening. He went to [facility] for Deviated Septum, was scheduled for surgery but never followed through with it. He was wanting a 2nd opionion for this.   He states that his Epi-Pen is out of date and he is allergic to Bees.   Fear of flying- He is going to be scheduling a trip to fly and would like to end up getting a small script of xanax for the times he does fly.  Current Outpatient Medications on File Prior to Visit  Medication Sig Dispense Refill  . sodium chloride (OCEAN) 0.65 % nasal spray Place into the nose.     No current facility-administered medications on file prior to visit.     Allergies  Allergen Reactions  . Naproxen Sodium Hives    Itchy hands-feet-hives   . Bee Venom Hives    Past Medical History:  Diagnosis Date  . Allergy   . Heart murmur   . History of chicken pox   . Migraines     Past Surgical History:  Procedure Laterality Date  . WISDOM TOOTH EXTRACTION      Family History  Problem Relation Age of Onset  . Cancer Father 43       prostate  . Coronary artery disease Other        GP  . Cancer Other        lung,skin,breast  . Diabetes Neg Hx   . Stroke Neg Hx   . Colon cancer  Neg Hx     Social History   Socioeconomic History  . Marital status: Married    Spouse name: Not on file  . Number of children: 1  . Years of education: Not on file  . Highest education level: Not on file  Occupational History  . Occupation: Editor, commissioning for the [employer]  . Financial resource strain: Not on file  . Food insecurity:    Worry: Not on file    Inability: Not on file  . Transportation needs:    Medical: Not on file    Non-medical: Not on file  Tobacco Use  . Smoking status: Never Smoker  . Smokeless tobacco: Never Used  Substance and Sexual Activity  . Alcohol use: Yes    Alcohol/week: 0.0 oz    Comment: 4-5 times weekly  . Drug use: No  . Sexual activity: Yes    Birth control/protection: Condom  Lifestyle  . Physical activity:    Days per week: Not on file    Minutes per session: Not on file  . Stress: Not on file  Relationships  .  Social connections:    Talks on phone: Not on file    Gets together: Not on file    Attends religious service: Not on file    Active member of club or organization: Not on file    Attends meetings of clubs or organizations: Not on file    Relationship status: Not on file  . Intimate partner violence:    Fear of current or ex partner: Not on file    Emotionally abused: Not on file    Physically abused: Not on file    Forced sexual activity: Not on file  Other Topics Concern  . Not on file  Social History Narrative   Married 07-2012, 1 son , wife pregnant   The PMH, PSH, Social History, Family History, Medications, and [facility], and have been updated if relevant.   Review of Systems  Constitutional: Negative.   HENT: Positive for congestion.   Eyes: Negative.   Respiratory: Negative.   Cardiovascular: Negative.   Gastrointestinal: Negative.   Endocrine: Negative.   Genitourinary: Negative.   Musculoskeletal: Negative.   Skin: Negative.   Allergic/Immunologic: Negative.     Hematological: Negative.   Psychiatric/Behavioral: Negative.   All other systems reviewed and are negative.      Objective:    BP 130/86 (BP Location: Left Arm, Patient Position: Sitting, Cuff Size: Normal)   Pulse 78   Temp 98.6 F (37 C) (Oral)   Ht 5' 9.75" (1.772 m)   Wt 232 lb 3.2 oz (105.3 kg)   SpO2 97%   BMI 33.56 kg/m   Wt Readings from Last 3 Encounters:  03/10/18 232 lb 3.2 oz (105.3 kg)  01/08/16 237 lb (107.5 kg)  05/05/15 228 lb (103.4 kg)     Physical Exam  Constitutional: He is oriented to person, place, and time. He appears well-developed and well-nourished. No distress.  HENT:  Head: Normocephalic and atraumatic.  Eyes: EOM are normal.  Neck: Normal range of motion. Neck supple.  Cardiovascular: Normal rate.  Pulmonary/Chest: Effort normal.  Musculoskeletal: Normal range of motion.  Neurological: He is alert and oriented to person, place, and time.  Skin: Skin is warm and dry. He is not diaphoretic.  Psychiatric: He has a normal mood and affect. His behavior is normal. Judgment and thought content normal.  Nursing note and vitals reviewed.         Assessment & Plan:   Other insomnia  Deviated nasal septum - Plan: Ambulatory referral to ENT, CANCELED: Ambulatory referral to ENT No follow-ups on file.

## 2018-03-10 NOTE — Assessment & Plan Note (Signed)
Feels nasal saline works best for him. Nasal steroids have not been as effective.

## 2018-03-10 NOTE — Assessment & Plan Note (Signed)
Rx faxed for xanax to be used prior to flight- he is aware of sedation and addiction potential.

## 2018-03-10 NOTE — Patient Instructions (Addendum)
Great to meet you.  Please schedule a physical on your way out (morning and fasting is preferable).  I am referring you to ENT- someone will call you.

## 2018-03-13 ENCOUNTER — Telehealth: Payer: Self-pay

## 2018-03-13 NOTE — Telephone Encounter (Signed)
TA-I looked this pt up on PMP and there is no data available for him/I faxed the Rx in on 7.2.19 so he did not get a physical script/I spoke with pharmacist who states that patient picked up Rx 2-3 minutes before I called/Plz advise/thx dmf   Copied from CRM [id number]. Topic: Quick Communication - See Telephone Encounter >> Mar 11, 2018  9:23 AM Herby AbrahamJohnson, Shiquita C wrote: CRM for notification. See Telephone encounter for: 03/11/18.  Pt says that provider wrote him a Rx for ALPRAZolam Prudy Feeler(XANAX) 0.5 MG tablet, pt says that it flew out the window, pt would like to know at some point if provider would write him another Rx? He said that it is not urgent.   Please advise.

## 2018-03-14 NOTE — Telephone Encounter (Signed)
So it sounds like he got the rx and there should be nothing more than needs to be done.  Is that correct?

## 2018-04-08 ENCOUNTER — Encounter: Payer: Self-pay | Admitting: Family Medicine

## 2018-04-08 ENCOUNTER — Ambulatory Visit (INDEPENDENT_AMBULATORY_CARE_PROVIDER_SITE_OTHER): Payer: 59 | Admitting: Family Medicine

## 2018-04-08 VITALS — BP 118/76 | HR 74 | Temp 98.4°F | Ht 68.75 in | Wt 232.8 lb

## 2018-04-08 DIAGNOSIS — Z13220 Encounter for screening for lipoid disorders: Secondary | ICD-10-CM | POA: Diagnosis not present

## 2018-04-08 DIAGNOSIS — Z8042 Family history of malignant neoplasm of prostate: Secondary | ICD-10-CM | POA: Diagnosis not present

## 2018-04-08 DIAGNOSIS — G4709 Other insomnia: Secondary | ICD-10-CM | POA: Diagnosis not present

## 2018-04-08 DIAGNOSIS — Z125 Encounter for screening for malignant neoplasm of prostate: Secondary | ICD-10-CM | POA: Diagnosis not present

## 2018-04-08 DIAGNOSIS — Z0000 Encounter for general adult medical examination without abnormal findings: Secondary | ICD-10-CM | POA: Diagnosis not present

## 2018-04-08 LAB — COMPREHENSIVE METABOLIC PANEL
ALK PHOS: 75 U/L (ref 39–117)
ALT: 38 U/L (ref 0–53)
AST: 27 U/L (ref 0–37)
Albumin: 4.5 g/dL (ref 3.5–5.2)
BILIRUBIN TOTAL: 0.8 mg/dL (ref 0.2–1.2)
BUN: 17 mg/dL (ref 6–23)
CALCIUM: 9.2 mg/dL (ref 8.4–10.5)
CO2: 31 mEq/L (ref 19–32)
CREATININE: 1.08 mg/dL (ref 0.40–1.50)
Chloride: 102 mEq/L (ref 96–112)
GFR: 82.51 mL/min (ref >60.00)
Glucose, Bld: 100 mg/dL — ABNORMAL HIGH (ref 70–99)
Potassium: 4.3 mEq/L (ref 3.5–5.1)
Sodium: 139 mEq/L (ref 135–145)
TOTAL PROTEIN: 7 g/dL (ref 6.0–8.3)

## 2018-04-08 LAB — LIPID PANEL
CHOL/HDL RATIO: 3
Cholesterol: 150 mg/dL (ref 0–200)
HDL: 48.7 mg/dL (ref >39.00)
LDL Cholesterol: 68 mg/dL (ref 0–99)
NONHDL: 101.79
TRIGLYCERIDES: 170 mg/dL — AB (ref 0.0–149.0)
VLDL: 34 mg/dL (ref 0.0–40.0)

## 2018-04-08 LAB — PSA: PSA: 0.97 ng/mL (ref 0.10–4.00)

## 2018-04-08 MED ORDER — ALPRAZOLAM 0.5 MG PO TABS: 0.5000 mg | ORAL_TABLET | Freq: Two times a day (BID) | ORAL | 0 refills | Status: DC | PRN

## 2018-04-08 NOTE — Assessment & Plan Note (Signed)
Reviewed preventive care protocols, scheduled due services, and updated immunizations Discussed nutrition, exercise, diet, and healthy lifestyle.  

## 2018-04-08 NOTE — Assessment & Plan Note (Signed)
The problem of recurrent insomnia is discussed. Avoidance of caffeine sources is strongly encouraged. Sleep hygiene issues are reviewed. The use of sedative hypnotics for temporary relief is appropriate; we discussed the addictive nature of these drugs, and a one-time only prescription for prn use of a hypnotic is given, to use no more than 3 times per week for 2-3 weeks.

## 2018-04-08 NOTE — Progress Notes (Signed)
Subjective:   Patient ID: Gregory Novak, male    DOB: 1981/05/18, 37 y.o.   MRN: [id number]  Gregory Novak is a pleasant 37 y.o. year old male who presents to clinic today with Annual Exam (Patient is here today for a CPE.  He is currently fasting today.  He is UTD on immunizations.  He states that he would like to have the Alprazolam for when he needs it on occas instead of only when he flies.)  on 04/08/2018  HPI:  Health Maintenance  Topic Date Due  . HIV Screening  03/10/2038 (Originally 11/14/1997)  . INFLUENZA VACCINE  04/09/2018  . TETANUS/TDAP  07/27/2018   Family history of prostate CA- dad diagnosed in his 14s.  He is asking if he can have an rx for xanax for occasional insomnia as well, not just fear of flying.  Last year, only used 30 tablets in one year- mainly insomnia related to travel.  Lab Results  Component Value Date   CHOL 131 01/03/2015   HDL 40.80 01/03/2015   LDLCALC 62 01/03/2015   TRIG 140.0 01/03/2015   CHOLHDL 3 01/03/2015   Lab Results  Component Value Date   ALT 39 09/17/2013   AST 24 09/17/2013   ALKPHOS 65 09/17/2013   BILITOT 0.8 09/17/2013   Lab Results  Component Value Date   NA 139 01/08/2016   K 4.2 01/08/2016   CL 102 01/08/2016   CO2 29 01/08/2016    Current Outpatient Medications on File Prior to Visit  Medication Sig Dispense Refill  . EPINEPHrine 0.3 mg/0.3 mL IJ SOAJ injection     . sodium chloride (OCEAN) 0.65 % nasal spray Place into the nose.     No current facility-administered medications on file prior to visit.     Allergies  Allergen Reactions  . Naproxen Sodium Hives    Itchy hands-feet-hives   . Bee Venom Hives    Past Medical History:  Diagnosis Date  . Allergy   . Heart murmur   . History of chicken pox   . Migraines     Past Surgical History:  Procedure Laterality Date  . WISDOM TOOTH EXTRACTION      Family History  Problem Relation Age of Onset  . Cancer Father 90       prostate  .  Asthma Sister   . Coronary artery disease Other        GP  . Cancer Other        lung,skin,breast  . Hearing loss Maternal Grandmother   . Cancer Maternal Grandmother   . Heart attack Maternal Grandfather   . Hypertension Maternal Grandfather   . Cancer Maternal Grandfather   . Cancer Paternal Grandmother   . Diabetes Paternal Grandmother   . Heart attack Paternal Grandfather   . Stroke Neg Hx   . Colon cancer Neg Hx     Social History   Socioeconomic History  . Marital status: Married    Spouse name: Not on file  . Number of children: 1  . Years of education: Not on file  . Highest education level: Not on file  Occupational History  . Occupation: Editor, commissioning for the [employer]  . Financial resource strain: Not on file  . Food insecurity:    Worry: Not on file    Inability: Not on file  . Transportation needs:    Medical: Not on file    Non-medical: Not on file  Tobacco Use  .  Smoking status: Never Smoker  . Smokeless tobacco: Never Used  Substance and Sexual Activity  . Alcohol use: Yes    Alcohol/week: 0.0 oz    Comment: 4-5 times weekly  . Drug use: No  . Sexual activity: Yes    Birth control/protection: Condom  Lifestyle  . Physical activity:    Days per week: Not on file    Minutes per session: Not on file  . Stress: Not on file  Relationships  . Social connections:    Talks on phone: Not on file    Gets together: Not on file    Attends religious service: Not on file    Active member of club or organization: Not on file    Attends meetings of clubs or organizations: Not on file    Relationship status: Not on file  . Intimate partner violence:    Fear of current or ex partner: Not on file    Emotionally abused: Not on file    Physically abused: Not on file    Forced sexual activity: Not on file  Other Topics Concern  . Not on file  Social History Narrative   Married 07-2012, 1 son , wife pregnant   The PMH, PSH, Social History, Family  History, Medications, and allergies [facility], and have been updated if relevant.   Review of Systems  Constitutional: Negative.   HENT: Negative.   Eyes: Negative.   Respiratory: Negative.   Cardiovascular: Negative.   Gastrointestinal: Negative.   Endocrine: Negative.   Genitourinary: Negative.   Musculoskeletal: Negative.   Skin: Negative.   Allergic/Immunologic: Negative.   Neurological: Negative.   Hematological: Negative.   Psychiatric/Behavioral: Positive for sleep disturbance. Negative for agitation, behavioral problems, confusion, decreased concentration, dysphoric mood, hallucinations and self-injury. The patient is not nervous/anxious and is not hyperactive.   All other systems reviewed and are negative.      Objective:    BP 118/76 (BP Location: Left Arm, Patient Position: Sitting, Cuff Size: Normal)   Pulse 74   Temp 98.4 F (36.9 C) (Oral)   Ht 5' 8.75" (1.746 m)   Wt 232 lb 12.8 oz (105.6 kg)   SpO2 97%   BMI 34.63 kg/m    Physical Exam   General:  pleasant male in no acute distress Eyes:  PERRL Ears:  External ear exam shows no significant lesions or deformities.  TMs normal bilaterally Hearing is grossly normal bilaterally. Nose:  External nasal examination shows no deformity or inflammation. Nasal mucosa are pink and moist without lesions or exudates. Mouth:  Oral mucosa and oropharynx without lesions or exudates.  Teeth in good repair. Neck:  no carotid bruit or thyromegaly no cervical or supraclavicular lymphadenopathy  Lungs:  Normal respiratory effort, chest expands symmetrically. Lungs are clear to auscultation, no crackles or wheezes. Heart:  Normal rate and regular rhythm. S1 and S2 normal without gallop, murmur, click, rub or other extra sounds. Abdomen:  Bowel sounds positive,abdomen soft and non-tender without masses, organomegaly or hernias noted. Pulses:  R and L posterior tibial pulses are full and equal bilaterally    Extremities:  no edema  Psych:  Good eye contact, not anxious or depressed appearing      Assessment & Plan:   Visit for well man health check  Screening, lipid - Plan: Lipid panel  Other insomnia - Plan: Comprehensive metabolic panel  Family history of prostate cancer - Plan: PSA  Prostate cancer screening - Plan: PSA No follow-ups  on file.

## 2019-03-17 ENCOUNTER — Telehealth: Payer: Self-pay

## 2019-03-17 DIAGNOSIS — M79671 Pain in right foot: Secondary | ICD-10-CM

## 2019-03-17 NOTE — Addendum Note (Signed)
Addended by: Janan Ridge on: 03/17/2019 03:25 PM   Modules accepted: Orders

## 2019-03-17 NOTE — Telephone Encounter (Signed)
Dr. Deborra Medina please advise Gregory Novak called stating that his right foot is in extreme pain for the past 3-4 days. Gregory Novak wife states that Gregory Novak foot is swollen no injury to the foot. You are virtual tomorrow and Gregory Novak is unavailable for appointment on Friday and Gregory Novak is in extreme pain. Please advise

## 2019-03-17 NOTE — Telephone Encounter (Signed)
Can we get him in with Sports med, like Dr. Charlann Boxer tomorrow?

## 2019-03-17 NOTE — Telephone Encounter (Signed)
Urgent referral sent. Pt aware of referral.

## 2019-04-14 ENCOUNTER — Encounter: Payer: 59 | Admitting: Family Medicine

## 2019-04-19 ENCOUNTER — Encounter: Payer: 59 | Admitting: Family Medicine

## 2020-04-20 ENCOUNTER — Encounter: Payer: 59 | Admitting: Family Medicine

## 2020-05-16 ENCOUNTER — Other Ambulatory Visit: Payer: Self-pay

## 2020-05-17 ENCOUNTER — Encounter: Payer: Self-pay | Admitting: Family Medicine

## 2020-05-17 ENCOUNTER — Ambulatory Visit (INDEPENDENT_AMBULATORY_CARE_PROVIDER_SITE_OTHER): Payer: 59

## 2020-05-17 ENCOUNTER — Ambulatory Visit (INDEPENDENT_AMBULATORY_CARE_PROVIDER_SITE_OTHER): Payer: 59 | Admitting: Family Medicine

## 2020-05-17 VITALS — BP 118/84 | HR 73 | Temp 97.9°F | Ht 68.75 in | Wt 250.0 lb

## 2020-05-17 DIAGNOSIS — M79645 Pain in left finger(s): Secondary | ICD-10-CM

## 2020-05-17 NOTE — Progress Notes (Signed)
Gregory Novak is a 39 y.o. male  Chief Complaint  Patient presents with   Acute Visit    ring  finger pain x 2 days while doing yardwork    HPI: Gregory Novak is a 39 y.o. male who complains of Lt ring finer pain x 2 days. This began while doing yard work specifically laying a Research officer, political party. Finger got "smashed" between two stones. Pain has improved. Swelling has improved. Still bruising present. No numbness or tingling. No decreased sensation.  Pt applied ice and took ibuprofen. He also buddy taped.    Past Medical History:  Diagnosis Date   Allergy    Heart murmur    History of chicken pox    Migraines     Past Surgical History:  Procedure Laterality Date   WISDOM TOOTH EXTRACTION      Social History   Socioeconomic History   Marital status: Married    Spouse name: Not on file   Number of children: 1   Years of education: Not on file   Highest education level: Not on file  Occupational History   Occupation: printing for the [employer]  Tobacco Use   Smoking status: Never Smoker   Smokeless tobacco: Never Used  Vaping Use   Vaping Use: Never used  Substance and Sexual Activity   Alcohol use: Yes    Alcohol/week: 0.0 standard drinks    Comment: 4-5 times weekly   Drug use: No   Sexual activity: Yes    Birth control/protection: Condom  Other Topics Concern   Not on file  Social History Narrative   Married 07-2012, 1 son , wife pregnant   Social Determinants of Health   Financial Resource Strain:    Difficulty of Paying Living Expenses: Not on file  Food Insecurity:    Worried About Programme researcher, broadcasting/film/video in the Last Year: Not on file   [employer] of Food in the Last Year: Not on file  Transportation Needs:    Lack of Transportation (Medical): Not on file   Lack of Transportation (Non-Medical): Not on file  Physical Activity:    Days of Exercise per Week: Not on file   Minutes of Exercise per Session: Not on file  Stress:     Feeling of Stress : Not on file  Social Connections:    Frequency of Communication with Friends and Family: Not on file   Frequency of Social Gatherings with Friends and Family: Not on file   Attends Religious Services: Not on file   Active Member of Clubs or Organizations: Not on file   Attends Banker Meetings: Not on file   Marital Status: Not on file  Intimate Partner Violence:    Fear of Current or Ex-Partner: Not on file   Emotionally Abused: Not on file   Physically Abused: Not on file   Sexually Abused: Not on file    Family History  Problem Relation Age of Onset   Cancer Father 72       prostate   Asthma Sister    Coronary artery disease Other        GP   Cancer Other        lung,skin,breast   Hearing loss Maternal Grandmother    Cancer Maternal Grandmother    Heart attack Maternal Grandfather    Hypertension Maternal Grandfather    Cancer Maternal Grandfather    Cancer Paternal Grandmother    Diabetes Paternal Grandmother    Heart  attack Paternal Grandfather    Stroke Neg Hx    Colon cancer Neg Hx      Immunization History  Administered Date(s) Administered   Influenza Whole 06/09/2010   Influenza, Seasonal, Injecte, Preservative Fre 09/15/2012   Influenza,inj,Quad PF,6+ Mos 09/17/2013   Td 07/27/2008    Outpatient Encounter Medications as of 05/17/2020  Medication Sig   ALPRAZolam (XANAX) 0.5 MG tablet Take 1 tablet (0.5 mg total) by mouth 2 (two) times daily as needed for anxiety or sleep.   EPINEPHrine 0.3 mg/0.3 mL IJ SOAJ injection    sodium chloride (OCEAN) 0.65 % nasal spray Place into the nose. (Patient not taking: Reported on 05/17/2020)   No facility-administered encounter medications on file as of 05/17/2020.     ROS: Pertinent positives and negatives noted in HPI. Remainder of ROS non-contributory   Allergies  Allergen Reactions   Naproxen Sodium Hives    Itchy hands-feet-hives    Bee Venom  Hives    BP 118/84    Pulse 73    Temp 97.9 F (36.6 C) (Temporal)    Ht 5' 8.75" (1.746 m)    Wt 250 lb (113.4 kg)    SpO2 97%    BMI 37.19 kg/m   Physical Exam Constitutional:      General: He is not in acute distress.    Appearance: Normal appearance. He is not ill-appearing.  Musculoskeletal:     Left hand: Swelling and tenderness present. Decreased range of motion.     Comments: Lt 4th finger with swelling and ecchymosis distal to DIP joint. Nail in tact. Pt does have good ROM  Neurological:     General: No focal deficit present.     Mental Status: He is alert and oriented to person, place, and time.      A/P:  1. Finger pain, left - smashed finger between 2 flagstones pavers  - DG Hand Complete Left; Future - will contact pt once result available Discussed plan and reviewed medications with patient, including risks, benefits, and potential side effects. Pt expressed understand. All questions answered.    This visit occurred during the SARS-CoV-2 public health emergency.  Safety protocols were in place, including screening questions prior to the visit, additional usage of staff PPE, and extensive cleaning of exam room while observing appropriate contact time as indicated for disinfecting solutions.

## 2020-06-28 ENCOUNTER — Other Ambulatory Visit: Payer: Self-pay

## 2020-06-29 ENCOUNTER — Ambulatory Visit (INDEPENDENT_AMBULATORY_CARE_PROVIDER_SITE_OTHER): Payer: 59 | Admitting: Family Medicine

## 2020-06-29 ENCOUNTER — Other Ambulatory Visit: Payer: Self-pay

## 2020-06-29 ENCOUNTER — Encounter: Payer: Self-pay | Admitting: Family Medicine

## 2020-06-29 VITALS — BP 118/84 | HR 73 | Temp 98.0°F | Ht 69.0 in | Wt 249.8 lb

## 2020-06-29 DIAGNOSIS — Z833 Family history of diabetes mellitus: Secondary | ICD-10-CM

## 2020-06-29 DIAGNOSIS — Z0000 Encounter for general adult medical examination without abnormal findings: Secondary | ICD-10-CM | POA: Diagnosis not present

## 2020-06-29 DIAGNOSIS — Z13220 Encounter for screening for lipoid disorders: Secondary | ICD-10-CM | POA: Diagnosis not present

## 2020-06-29 DIAGNOSIS — Z91030 Bee allergy status: Secondary | ICD-10-CM

## 2020-06-29 DIAGNOSIS — F40243 Fear of flying: Secondary | ICD-10-CM

## 2020-06-29 DIAGNOSIS — Z2821 Immunization not carried out because of patient refusal: Secondary | ICD-10-CM

## 2020-06-29 MED ORDER — ALPRAZOLAM 0.5 MG PO TABS: 0.5000 mg | ORAL_TABLET | Freq: Two times a day (BID) | ORAL | 0 refills | Status: DC | PRN

## 2020-06-29 MED ORDER — EPINEPHRINE 0.3 MG/0.3ML IJ SOAJ: 0.3000 mg | INTRAMUSCULAR | 1 refills | Status: AC | PRN

## 2020-06-29 NOTE — Progress Notes (Signed)
[name] is a 39 y.o. male  Chief Complaint  Patient presents with  . Establish Care    TOC- CPE/labs. not fasting this am.  no concerns.  declines flu shot.    HPI: Gregory Novak is a 39 y.o. male who is a former patient of Dr. Dayton Martes seen today for annual CPE, labs. He is not fasting today. He declines flu vaccine.  Dental: UTD Vision: does not wear glasses or contacts and no vision issues Diet/Exercise: could be better at times, moderate amount of take-out; yard work but no regular CV exercise; pt had worked with Systems analyst in the past   Med refills needed today: xanax 0.5mg  1 tab PRN when flying  Past Medical History:  Diagnosis Date  . Allergy   . Heart murmur   . History of chicken pox   . Migraines     Past Surgical History:  Procedure Laterality Date  . WISDOM TOOTH EXTRACTION      Social History   Socioeconomic History  . Marital status: Married    Spouse name: Not on file  . Number of children: 1  . Years of education: Not on file  . Highest education level: Not on file  Occupational History  . 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 Smoking status: Never Smoker  . Smokeless tobacco: Never Used  Vaping Use  . Vaping Use: Never used  Substance and Sexual Activity  . Alcohol use: Yes    Alcohol/week: 0.0 standard drinks    Comment: 4-5 times weekly  . Drug use: No  . Sexual activity: Yes    Birth control/protection: Condom  Other Topics Concern  . Not on file  Social History Narrative   Married 07-2012, 1 son , wife pregnant   Social Determinants of Health   Financial Resource Strain:   . Difficulty of Paying Living Expenses: Not on file  Food Insecurity:   . Worried About Programme researcher, broadcasting/film/video in the Last Year: Not on file  . Ran Out of Food in the Last Year: Not on file  Transportation Needs:   . Lack of Transportation (Medical): Not on file  . Lack of Transportation (Non-Medical): Not on file  Physical Activity:     . Days of Exercise per Week: Not on file  . Minutes of Exercise per Session: Not on file  Stress:   . Feeling of Stress : Not on file  Social Connections:   . Frequency of Communication with Friends and Family: Not on file  . Frequency of Social Gatherings with Friends and Family: Not on file  . Attends Religious Services: Not on file  . Active Member of Clubs or Organizations: Not on file  . Attends Banker Meetings: Not on file  . Marital Status: Not on file  Intimate Partner Violence:   . Fear of Current or Ex-Partner: Not on file  . Emotionally Abused: Not on file  . Physically Abused: Not on file  . Sexually Abused: Not on file    Family History  Problem Relation Age of Onset  . Cancer Father 46       prostate  . Asthma Sister   . Coronary artery disease Other        GP  . Cancer Other        lung,skin,breast  . Hearing loss Maternal Grandmother   . Cancer Maternal Grandmother   . Heart attack Maternal Grandfather   . Hypertension  Maternal Grandfather   . Cancer Maternal Grandfather   . Cancer Paternal Grandmother   . Diabetes Paternal Grandmother   . Heart attack Paternal Grandfather   . Stroke Neg Hx   . Colon cancer Neg Hx      Immunization History  Administered Date(s) Administered  . Influenza Whole 06/09/2010  . Influenza, Seasonal, Injecte, Preservative Fre 09/15/2012  . Influenza,inj,Quad PF,6+ Mos 09/17/2013  . PFIZER SARS-COV-2 Vaccination 12/10/2019, 01/07/2020  . Td 07/27/2008    Outpatient Encounter Medications as of 06/29/2020  Medication Sig  . ALPRAZolam (XANAX) 0.5 MG tablet Take 1 tablet (0.5 mg total) by mouth 2 (two) times daily as needed for anxiety or sleep.  Marland Kitchen EPINEPHrine 0.3 mg/0.3 mL IJ SOAJ injection Inject 0.3 mg into the muscle as needed for anaphylaxis.  . [DISCONTINUED] ALPRAZolam (XANAX) 0.5 MG tablet Take 1 tablet (0.5 mg total) by mouth 2 (two) times daily as needed for anxiety or sleep.  . [DISCONTINUED]  EPINEPHrine 0.3 mg/0.3 mL IJ SOAJ injection   . [DISCONTINUED] sodium chloride (OCEAN) 0.65 % nasal spray Place into the nose. (Patient not taking: Reported on 05/17/2020)   No facility-administered encounter medications on file as of 06/29/2020.     ROS: Gen: no fever, chills  Skin: no rash, itching ENT: no ear pain, ear drainage, nasal congestion, rhinorrhea, sinus pressure, sore throat Eyes: no blurry vision, double vision Resp: no cough, wheeze,SOB CV: no CP, palpitations, LE edema,  GI: no heartburn, n/v/d/c, abd pain GU: no dysuria, urgency, frequency, hematuria; no testicular swelling or masses MSK: no joint pain, myalgias, back pain Neuro: no dizziness, headache, weakness, vertigo Psych: no depression, anxiety, insomnia   Allergies  Allergen Reactions  . Naproxen Sodium Hives    Itchy hands-feet-hives   . Bee Venom Hives    BP 118/84   Pulse 73   Temp 98 F (36.7 C) (Temporal)   Ht 5\' 9"  (1.753 m)   Wt 249 lb 12.8 oz (113.3 kg)   SpO2 97%   BMI 36.89 kg/m   Physical Exam Constitutional:      General: He is not in acute distress.    Appearance: He is well-developed.  HENT:     Head: Normocephalic and atraumatic.     Right Ear: Tympanic membrane and ear canal normal.     Left Ear: Tympanic membrane and ear canal normal.     Nose: Nose normal.  Neck:     Thyroid: No thyromegaly.  Cardiovascular:     Rate and Rhythm: Normal rate and regular rhythm.     Heart sounds: Normal heart sounds.  Pulmonary:     Effort: Pulmonary effort is normal.     Breath sounds: Normal breath sounds. No wheezing or rhonchi.  Abdominal:     General: Bowel sounds are normal. There is no distension.     Palpations: Abdomen is soft. There is no mass.     Tenderness: There is no abdominal tenderness. There is no guarding or rebound.  Musculoskeletal:        General: Normal range of motion.     Cervical back: Neck supple.     Right lower leg: No edema.     Left lower leg: No  edema.  Lymphadenopathy:     Cervical: No cervical adenopathy.  Skin:    General: Skin is warm and dry.  Neurological:     Mental Status: He is alert and oriented to person, place, and time.     Motor: No abnormal  muscle tone.     Coordination: Coordination normal.      A/P:  1. Annual physical exam - discussed importance of regular CV exercise, healthy diet, adequate sleep - UTD on dental exam - declines flu vaccine, but otherwise UTD on immunizations - Comprehensive metabolic panel; Future - Lipid panel; Future - CBC; Future - next CPE in 1 year  2. Family history of diabetes mellitus (DM) - Hemoglobin A1c; Future  3. Screening for lipid disorders - Lipid panel; Future  4. Influenza vaccination declined by patient  5. Fear of flying - database reviewed and appropriate Refill: - ALPRAZolam (XANAX) 0.5 MG tablet; Take 1 tablet (0.5 mg total) by mouth 2 (two) times daily as needed for anxiety or sleep.  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  Dispense: 1 each; Refill: 1  This visit occurred during the SARS-CoV-2 public health emergency.  Safety protocols were in place, including screening questions prior to the visit, additional usage of staff PPE, and extensive cleaning of exam room while observing appropriate contact time as indicated for disinfecting solutions.

## 2020-06-30 ENCOUNTER — Other Ambulatory Visit (INDEPENDENT_AMBULATORY_CARE_PROVIDER_SITE_OTHER): Payer: 59

## 2020-06-30 DIAGNOSIS — Z833 Family history of diabetes mellitus: Secondary | ICD-10-CM

## 2020-06-30 DIAGNOSIS — Z0000 Encounter for general adult medical examination without abnormal findings: Secondary | ICD-10-CM

## 2020-06-30 DIAGNOSIS — Z13220 Encounter for screening for lipoid disorders: Secondary | ICD-10-CM

## 2020-06-30 LAB — COMPREHENSIVE METABOLIC PANEL
ALT: 45 U/L (ref 0–53)
AST: 24 U/L (ref 0–37)
Albumin: 4.3 g/dL (ref 3.5–5.2)
Alkaline Phosphatase: 81 U/L (ref 39–117)
BUN: 19 mg/dL (ref 6–23)
CO2: 29 mEq/L (ref 19–32)
Calcium: 9 mg/dL (ref 8.4–10.5)
Chloride: 102 mEq/L (ref 96–112)
Creatinine, Ser: 1.08 mg/dL (ref 0.40–1.50)
GFR: 87.6 mL/min (ref >60.00)
Glucose, Bld: 91 mg/dL (ref 70–99)
Potassium: 4.6 mEq/L (ref 3.5–5.1)
Sodium: 137 mEq/L (ref 135–145)
Total Bilirubin: 0.4 mg/dL (ref 0.2–1.2)
Total Protein: 6.4 g/dL (ref 6.0–8.3)

## 2020-06-30 LAB — LIPID PANEL
Cholesterol: 149 mg/dL (ref 0–200)
HDL: 40.3 mg/dL (ref >39.00)
LDL Cholesterol: 74 mg/dL (ref 0–99)
NonHDL: 108.56
Total CHOL/HDL Ratio: 4
Triglycerides: 175 mg/dL — ABNORMAL HIGH (ref 0.0–149.0)
VLDL: 35 mg/dL (ref 0.0–40.0)

## 2020-06-30 LAB — HEMOGLOBIN A1C: Hgb A1c MFr Bld: 5.3 % (ref 4.6–6.5)

## 2020-06-30 LAB — CBC
HCT: 45.2 % (ref 39.0–52.0)
Hemoglobin: 15.7 g/dL (ref 13.0–17.0)
MCHC: 34.8 g/dL (ref 30.0–36.0)
MCV: 93 fl (ref 78.0–100.0)
Platelets: 222 10*3/uL (ref 150.0–400.0)
RBC: 4.86 Mil/uL (ref 4.22–5.81)
RDW: 12.2 % (ref 11.5–15.5)
WBC: 6.8 10*3/uL (ref 4.0–10.5)

## 2021-07-05 ENCOUNTER — Encounter: Payer: 59 | Admitting: Family Medicine

## 2021-10-24 DIAGNOSIS — J020 Streptococcal pharyngitis: Secondary | ICD-10-CM | POA: Diagnosis not present

## 2022-03-29 ENCOUNTER — Ambulatory Visit (INDEPENDENT_AMBULATORY_CARE_PROVIDER_SITE_OTHER): Payer: BC Managed Care – PPO | Admitting: Family Medicine

## 2022-03-29 ENCOUNTER — Encounter: Payer: Self-pay | Admitting: Family Medicine

## 2022-03-29 VITALS — BP 122/84 | HR 95 | Temp 98.7°F | Ht 69.0 in | Wt 261.4 lb

## 2022-03-29 DIAGNOSIS — F40243 Fear of flying: Secondary | ICD-10-CM

## 2022-03-29 DIAGNOSIS — Z1159 Encounter for screening for other viral diseases: Secondary | ICD-10-CM

## 2022-03-29 DIAGNOSIS — Z0000 Encounter for general adult medical examination without abnormal findings: Secondary | ICD-10-CM | POA: Diagnosis not present

## 2022-03-29 MED ORDER — ALPRAZOLAM 0.5 MG PO TABS: 0.5000 mg | ORAL_TABLET | Freq: Two times a day (BID) | ORAL | 0 refills | Status: AC | PRN

## 2022-03-29 NOTE — Patient Instructions (Signed)
Welcome to [facility]! It was a pleasure meeting you today. ° °As discussed, Please schedule a 12 month follow up visit today. ° °PLEASE NOTE: ° °If you had any LAB tests please let us know if you have not heard back within a few days. You may see your results on MyChart before we have a chance to review them but we will give you a call once they are reviewed by us. If we ordered any REFERRALS today, please let us know if you have not heard from their office within the next week.  °Let us know through MyChart if you are needing REFILLS, or have your pharmacy send us the request. You can also use MyChart to communicate with me or any office staff. ° °Please try these tips to maintain a healthy lifestyle: ° °Eat most of your calories during the day when you are active. Eliminate processed foods including packaged sweets (pies, cakes, cookies), reduce intake of potatoes, white bread, white pasta, and white rice. Look for whole grain options, oat flour or almond flour. ° °Each meal should contain half fruits/vegetables, one quarter protein, and one quarter carbs (no bigger than a computer mouse). ° °Cut down on sweet beverages. This includes juice, soda, and sweet tea. Also watch fruit intake, though this is a healthier sweet option, it still contains natural sugar! Limit to 3 servings daily. ° °Drink at least 1 glass of water with each meal and aim for at least 8 glasses per day ° °Exercise at least 150 minutes every week.   °

## 2022-03-29 NOTE — Progress Notes (Unsigned)
Phone: [phone]   Subjective:  Patient 41 y.o. male presenting for annual physical.  Chief Complaint  Patient presents with   Establish Care    Need new pcp Not fasting Would like referral of Xanax Due for annual exam   Toc -annual cpx.  Tdap-[facility] yr-[facility]. Ate PTA Fear of flying,.  Request xanax-flies few x/yr  See problem oriented charting- ROS- ROS: Gen: no fever, chills  Skin: no rash, itching ENT: no ear pain, ear drainage, nasal congestion, rhinorrhea, sinus pressure, sore throat  deviated septum-saw Dr Pollyann Kennedy in past-didn't do surgery.  Has "huge" turbinates -doesn't sleep well. Sleeps w/wedge pillow. Eyes: no blurry vision, double vision Resp: no cough, wheeze,SOB  had uri for sev wks.  Still some wheeze at hs on R-improving CV: no CP, palpitations, LE edema,  GI: no heartburn, n/v/d/c, abd pain GU: no dysuria, urgency, frequency, hematuria MSK: no joint pain, myalgias, back pain Neuro: no dizziness, headache, weakness, vertigo Psych: no depression, anxiety, insomnia, SI   The following were reviewed and entered/updated in epic: Past Medical History:  Diagnosis Date   Allergy    Heart murmur    History of chicken pox    Migraines    Patient Active Problem List   Diagnosis Date Noted   Visit for well man health check 04/08/2018   Deviated nasal septum 10/07/2017   Allergic rhinitis 08/15/2011   Insomnia- fear of flying. xanax prn 08/15/2011   Internal hemorrhoids 07/28/2009   Past Surgical History:  Procedure Laterality Date   WISDOM TOOTH EXTRACTION      Family History  Problem Relation Age of Onset   Cancer Father 58       prostate   Asthma Sister    Coronary artery disease Other        GP   Cancer Other        lung,skin,breast   Hearing loss Maternal Grandmother    Cancer Maternal Grandmother    Heart attack Maternal Grandfather    Hypertension Maternal Grandfather    Cancer Maternal Grandfather    Cancer Paternal Grandmother    Diabetes  Paternal Grandmother    Heart attack Paternal Grandfather    Stroke Neg Hx    Colon cancer Neg Hx     Medications- reviewed and updated Current Outpatient Medications  Medication Sig Dispense Refill   ALPRAZolam (XANAX) 0.5 MG tablet Take 1 tablet (0.5 mg total) by mouth 2 (two) times daily as needed for anxiety or sleep. 20 tablet 0   EPINEPHrine 0.3 mg/0.3 mL IJ SOAJ injection Inject 0.3 mg into the muscle as needed for anaphylaxis. 1 each 1   No current facility-administered medications for this visit.    Allergies-reviewed and updated Allergies  Allergen Reactions   Naproxen Sodium Hives    Itchy hands-feet-hives    Bee Venom Hives    Social History   Social History Narrative   Married 07-2012, 1 son , wife pregnant   Objective  Objective:  BP 122/84   Pulse 95   Temp 98.7 F (37.1 C) (Temporal)   Ht 5\' 9"  (1.753 m)   Wt 261 lb 6 oz (118.6 kg)   SpO2 97%   BMI 38.60 kg/m  Physical Exam Constitutional:      Appearance: Normal appearance.  HENT:     Head: Normocephalic and atraumatic.     Right Ear: Tympanic membrane, ear canal and external ear normal.     Left Ear: Tympanic membrane, ear canal and external ear normal.  Nose: Nose normal.     Mouth/Throat:     Mouth: Mucous membranes are moist.     Pharynx: Oropharynx is clear.  Eyes:     General: No scleral icterus.    Extraocular Movements: Extraocular movements intact.     Conjunctiva/sclera: Conjunctivae normal.     Pupils: Pupils are equal, round, and reactive to light.  Neck:     Vascular: No carotid bruit.  Cardiovascular:     Rate and Rhythm: Normal rate and regular rhythm.     Pulses: Normal pulses.     Heart sounds: No murmur heard. Pulmonary:     Effort: Pulmonary effort is normal.     Breath sounds: Normal breath sounds.  Abdominal:     General: Abdomen is flat. Bowel sounds are normal. There is no distension.     Palpations: Abdomen is soft. There is no mass.     Tenderness: There  is no abdominal tenderness.  Musculoskeletal:     Cervical back: Neck supple.     Right lower leg: No edema.     Left lower leg: No edema.  Lymphadenopathy:     Cervical: No cervical adenopathy.  Neurological:     General: No focal deficit present.     Mental Status: He is alert and oriented to person, place, and time.     Cranial Nerves: No cranial nerve deficit.  Psychiatric:        Mood and Affect: Mood normal.     Pdmp checked.   Assessment and Plan   Health Maintenance counseling: 1. Anticipatory guidance: Patient counseled regarding regular dental exams q6 months, eye exams yearly, avoiding smoking and second hand smoke, limiting alcohol to 2 beverages per day.   2. Risk factor reduction:  Advised patient of need for regular exercise and diet rich in fruits and vegetables to reduce risk of heart attack and stroke. Exercise- start.   Wt Readings from Last 3 Encounters:  03/29/22 261 lb 6 oz (118.6 kg)  06/29/20 249 lb 12.8 oz (113.3 kg)  05/17/20 250 lb (113.4 kg)   3. Immunizations/screenings/ancillary studies Immunization History  Administered Date(s) Administered   Influenza Whole 06/09/2010   Influenza, Seasonal, Injecte, Preservative Fre 09/15/2012   Influenza,inj,Quad PF,6+ Mos 09/17/2013   PFIZER(Purple Top)SARS-COV-2 Vaccination 12/10/2019, 01/07/2020   Td 07/27/2008   Health Maintenance Due  Topic Date Due   Hepatitis C Screening  Never done   TETANUS/TDAP  07/27/2018      Problem List Items Addressed This Visit   None Visit Diagnoses     Fear of flying           Recommended follow up: annualNo follow-ups on file. No future appointments.   Lab/Order associations:not fasting   ICD-10-CM   1. 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    Angelena Sole, MD

## 2022-04-01 LAB — COMPREHENSIVE METABOLIC PANEL
AG Ratio: 2 (calc) (ref 1.0–2.5)
ALT: 52 U/L — ABNORMAL HIGH (ref 9–46)
AST: 33 U/L (ref 10–40)
Albumin: 4.7 g/dL (ref 3.6–5.1)
Alkaline phosphatase (APISO): 83 U/L (ref 36–130)
BUN/Creatinine Ratio: 14 (calc) (ref 6–22)
BUN: 18 mg/dL (ref 7–25)
CO2: 25 mmol/L (ref 20–32)
Calcium: 9.1 mg/dL (ref 8.6–10.3)
Chloride: 102 mmol/L (ref 98–110)
Creat: 1.32 mg/dL — ABNORMAL HIGH (ref 0.60–1.26)
Globulin: 2.3 g/dL (calc) (ref 1.9–3.7)
Glucose, Bld: 93 mg/dL (ref 65–99)
Potassium: 4.1 mmol/L (ref 3.5–5.3)
Sodium: 139 mmol/L (ref 135–146)
Total Bilirubin: 0.6 mg/dL (ref 0.2–1.2)
Total Protein: 7 g/dL (ref 6.1–8.1)

## 2022-04-01 LAB — CBC WITH DIFFERENTIAL/PLATELET
Absolute Monocytes: 632 cells/uL (ref 200–950)
Basophils Absolute: 21 cells/uL (ref 0–200)
Basophils Relative: 0.3 %
Eosinophils Absolute: 163 cells/uL (ref 15–500)
Eosinophils Relative: 2.3 %
HCT: 46.3 % (ref 38.5–50.0)
Hemoglobin: 15.4 g/dL (ref 13.2–17.1)
Lymphs Abs: 1690 cells/uL (ref 850–3900)
MCH: 31.6 pg (ref 27.0–33.0)
MCHC: 33.3 g/dL (ref 32.0–36.0)
MCV: 94.9 fL (ref 80.0–100.0)
MPV: 10.3 fL (ref 7.5–12.5)
Monocytes Relative: 8.9 %
Neutro Abs: 4594 cells/uL (ref 1500–7800)
Neutrophils Relative %: 64.7 %
Platelets: 253 10*3/uL (ref 140–400)
RBC: 4.88 10*6/uL (ref 4.20–5.80)
RDW: 12.6 % (ref 11.0–15.0)
Total Lymphocyte: 23.8 %
WBC: 7.1 10*3/uL (ref 3.8–10.8)

## 2022-04-01 LAB — LIPID PANEL
Cholesterol: 162 mg/dL (ref <200)
HDL: 43 mg/dL (ref >=40)
LDL Cholesterol (Calc): 90 mg/dL (calc)
Non-HDL Cholesterol (Calc): 119 mg/dL (calc) (ref <130)
Total CHOL/HDL Ratio: 3.8 (calc) (ref <5.0)
Triglycerides: 203 mg/dL — ABNORMAL HIGH (ref <150)

## 2022-04-01 LAB — HEMOGLOBIN A1C
Hgb A1c MFr Bld: 5 % of total Hgb (ref <5.7)
Mean Plasma Glucose: 97 mg/dL
eAG (mmol/L): 5.4 mmol/L

## 2022-04-01 LAB — TSH: TSH: 1.5 mIU/L (ref 0.40–4.50)

## 2022-04-01 LAB — HEPATITIS C ANTIBODY: Hepatitis C Ab: NONREACTIVE

## 2022-04-02 ENCOUNTER — Other Ambulatory Visit: Payer: Self-pay | Admitting: *Deleted

## 2022-04-02 DIAGNOSIS — R7989 Other specified abnormal findings of blood chemistry: Secondary | ICD-10-CM

## 2022-04-02 DIAGNOSIS — R944 Abnormal results of kidney function studies: Secondary | ICD-10-CM

## 2022-04-07 IMAGING — DX DG HAND COMPLETE 3+V*L*
3 series · 3 of 3 positions shown · non-contrast
Comparison: None.

CLINICAL DATA: 39-year-old male with persistent pain of the 4th
finger after crush injury between stones.

EXAM:
LEFT HAND - COMPLETE 3+ VIEW

[hand pa]
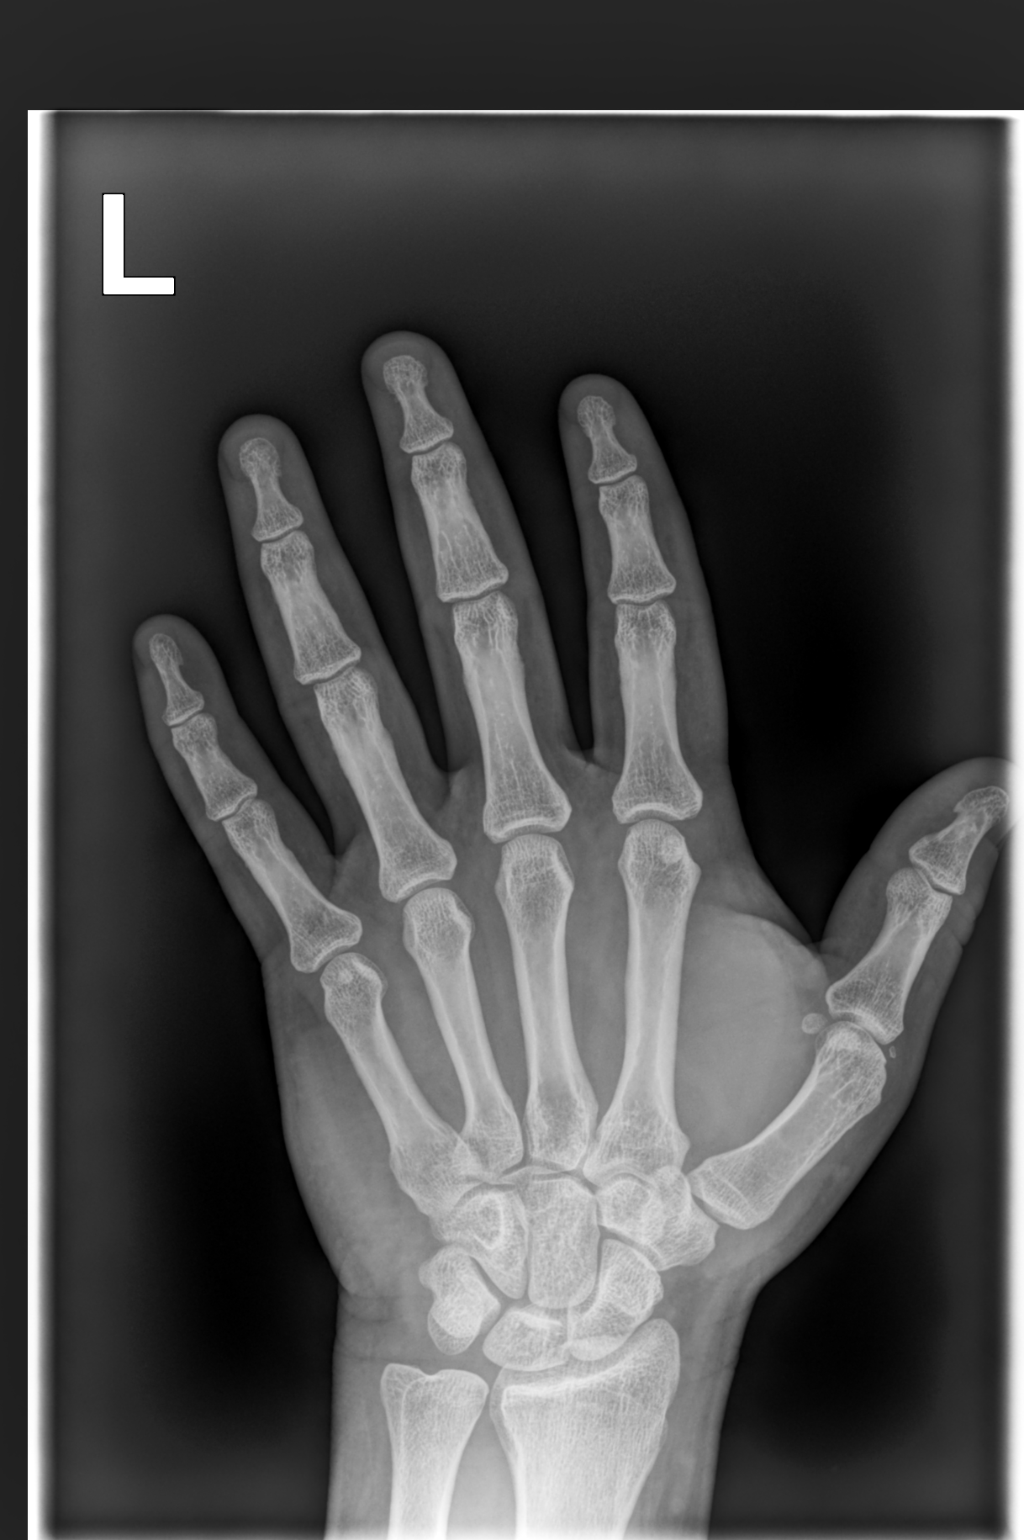

[hand mlo]
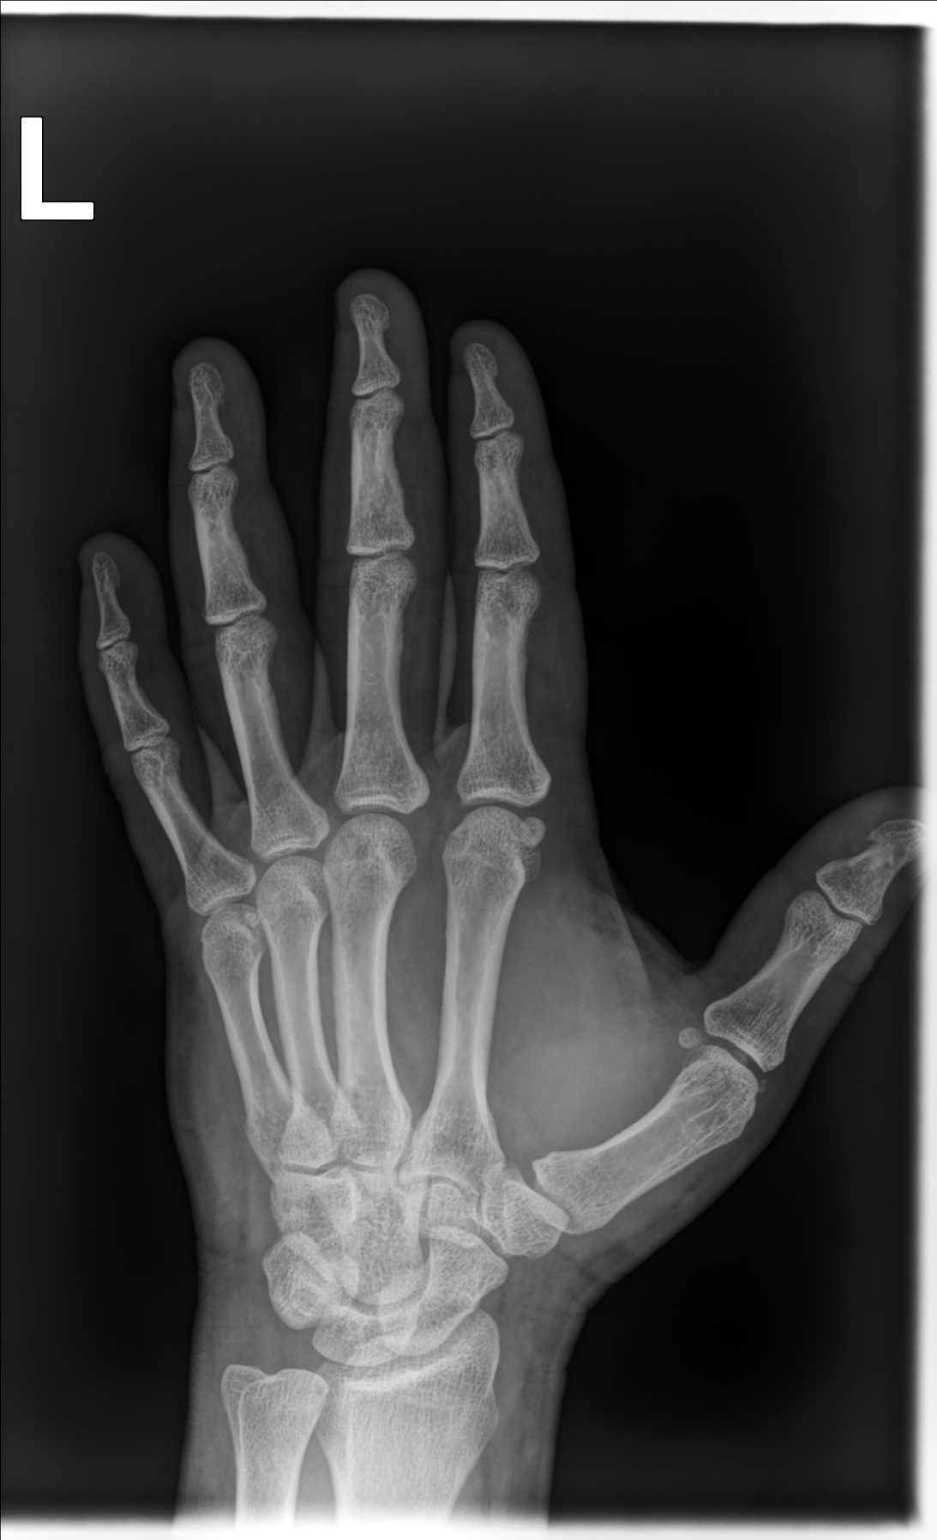

[hand lat]
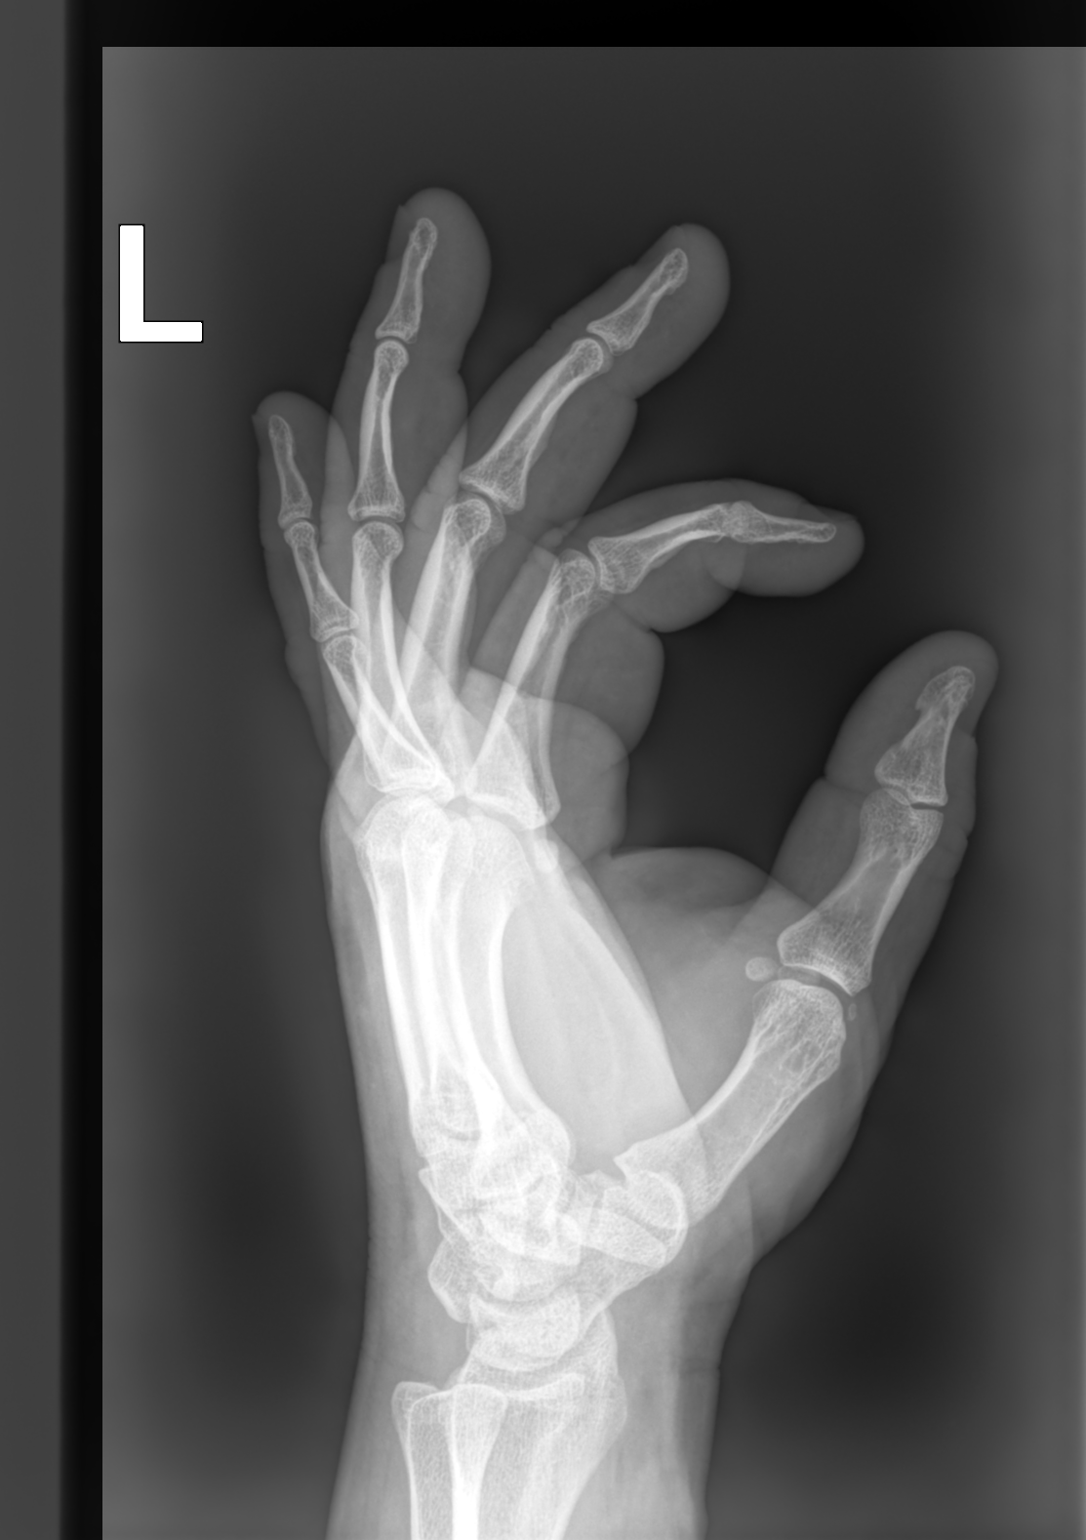

[3 of 3 positions shown; findings below may reference images not displayed]

FINDINGS: Bone mineralization is within normal limits. There is no evidence of
fracture or dislocation. There is no evidence of arthropathy or
other focal bone abnormality. No discrete soft tissue injury. No
radiopaque foreign body identified.
IMPRESSION: Negative.

## 2022-05-09 ENCOUNTER — Encounter: Payer: Self-pay | Admitting: Physician Assistant

## 2022-05-09 ENCOUNTER — Telehealth: Payer: BC Managed Care – PPO | Admitting: Physician Assistant

## 2022-05-09 DIAGNOSIS — L255 Unspecified contact dermatitis due to plants, except food: Secondary | ICD-10-CM | POA: Diagnosis not present

## 2022-05-09 MED ORDER — TRIAMCINOLONE ACETONIDE 0.5 % EX OINT: 1.0000 | TOPICAL_OINTMENT | Freq: Two times a day (BID) | CUTANEOUS | 0 refills | Status: DC

## 2022-05-09 NOTE — Progress Notes (Signed)
Virtual Visit Consent   Gregory Novak, you are scheduled for a virtual visit with a [facility] provider today. Just as with appointments in the office, your consent must be obtained to participate. Your consent will be active for this visit and any virtual visit you may have with one of our providers in the next 365 days. If you have a MyChart account, a copy of this consent can be sent to you electronically.  As this is a virtual visit, video technology does not allow for your provider to perform a traditional examination. This may limit your provider's ability to fully assess your condition. If your provider identifies any concerns that need to be evaluated in person or the need to arrange testing (such as labs, EKG, etc.), we will make arrangements to do so. Although advances in technology are sophisticated, we cannot ensure that it will always work on either your end or our end. If the connection with a video visit is poor, the visit may have to be switched to a telephone visit. With either a video or telephone visit, we are not always able to ensure that we have a secure connection.  By engaging in this virtual visit, you consent to the provision of healthcare and authorize for your insurance to be billed (if applicable) for the services provided during this visit. Depending on your insurance coverage, you may receive a charge related to this service.  I need to obtain your verbal consent now. Are you willing to proceed with your visit today? Gregory Novak has provided verbal consent on 05/09/2022 for a virtual visit (video or telephone). Gregory Novak, [place]  Date: 05/09/2022 8:25 AM  Virtual Visit via Video Note   I, Gregory Novak, connected with  Gregory Novak  ([id number], April 18, 1981) on 05/09/22 at  8:00 AM EDT by a video-enabled telemedicine application and verified that I am speaking with the correct person using two identifiers.  Location: Patient: Virtual Visit  Location Patient: Home Provider: Virtual Visit Location Provider: Home Office   I discussed the limitations of evaluation and management by telemedicine and the availability of in person appointments. The patient expressed understanding and agreed to proceed.    History of Present Illness: Gregory Novak is a 41 y.o. who identifies as a male who was assigned male at birth, and is being seen today for poison ivy rash starting on bilateral forearms last week after known exposure. Has spread to belly and side as well. Notes areas of rash are in linear pattern. Notes pruritus but denies pain or drainage from site. Denies fever, chills. Has been applying calamine lotion OTC. This helps but not resolving the issue.  HPI: HPI  Problems:  Patient Active Problem List   Diagnosis Date Noted   Visit for well man health check 04/08/2018   Deviated nasal septum 10/07/2017   Allergic rhinitis 08/15/2011   Insomnia- fear of flying. xanax prn 08/15/2011   Internal hemorrhoids 07/28/2009    Allergies:  Allergies  Allergen Reactions   Naproxen Sodium Hives    Itchy hands-feet-hives    Bee Venom Hives   Medications:  Current Outpatient Medications:    triamcinolone ointment (KENALOG) 0.5 %, Apply 1 Application topically 2 (two) times daily., Disp: 30 g, Rfl: 0   ALPRAZolam (XANAX) 0.5 MG tablet, Take 1 tablet (0.5 mg total) by mouth 2 (two) times daily as needed for anxiety or sleep., Disp: 10 tablet, Rfl: 0   EPINEPHrine 0.3 mg/0.3 mL IJ  SOAJ injection, Inject 0.3 mg into the muscle as needed for anaphylaxis., Disp: 1 each, Rfl: 1  Observations/Objective: Patient is well-developed, well-nourished in no acute distress.  Resting comfortably at home.  Head is normocephalic, atraumatic.  No labored breathing. Speech is clear and coherent with logical content.  Patient is alert and oriented at baseline.  Papulovesicular rash on erythematous base noted in various areas of forearms bilaterally, in  linear distribution mostly. Some scattered areas of belly.  Assessment and Plan: 1. Rhus dermatitis - triamcinolone ointment (KENALOG) 0.5 %; Apply 1 Application topically 2 (two) times daily.  Dispense: 30 g; Refill: 0  Smaller areas of rash thankfully. Supportive measures and OTC medications reviewed. Start kenalog ointment BID. Follow-up if not resolving.  Follow Up Instructions: I discussed the assessment and treatment plan with the patient. The patient was provided an opportunity to ask questions and all were answered. The patient agreed with the plan and demonstrated an understanding of the instructions.  A copy of instructions were sent to the patient via MyChart unless otherwise noted below.   The patient was advised to call back or seek an in-person evaluation if the symptoms worsen or if the condition fails to improve as anticipated.  Time:  I spent 8 minutes with the patient via telehealth technology discussing the above problems/concerns.    Gregory Climes, PA-C

## 2022-05-09 NOTE — Patient Instructions (Signed)
  Gregory Novak, thank you for joining [name], PA-C for today's virtual visit.  While this provider is not your primary care provider (PCP), if your PCP is located in our provider database this encounter information will be shared with them immediately following your visit.  Consent: (Patient) Gregory Novak provided verbal consent for this virtual visit at the beginning of the encounter.  Current Medications:  Current Outpatient Medications:    triamcinolone ointment (KENALOG) 0.5 %, Apply 1 Application topically 2 (two) times daily., Disp: 30 g, Rfl: 0   ALPRAZolam (XANAX) 0.5 MG tablet, Take 1 tablet (0.5 mg total) by mouth 2 (two) times daily as needed for anxiety or sleep., Disp: 10 tablet, Rfl: 0   EPINEPHrine 0.3 mg/0.3 mL IJ SOAJ injection, Inject 0.3 mg into the muscle as needed for anaphylaxis., Disp: 1 each, Rfl: 1   Medications ordered in this encounter:  Meds ordered this encounter  Medications   triamcinolone ointment (KENALOG) 0.5 %    Sig: Apply 1 Application topically 2 (two) times daily.    Dispense:  30 g    Refill:  0    Order Specific Question:   Supervising Provider    Answer:   Hyacinth Meeker, BRIAN [[id number]]     *If you need refills on other medications prior to your next appointment, please contact your pharmacy*  Follow-Up: Call back or seek an in-person evaluation if the symptoms worsen or if the condition fails to improve as anticipated.  Other Instructions Please keep the skin clean and dry.  Apply witch hazel to the area to help dry up lesions and soothe itch. Start the kenalog ointment and apply as directed.  If not resolving, or you note any new or worsening symptoms, please message [place] or be seen in person.   If you have been instructed to have an in-person evaluation today at a local Urgent Care facility, please use the link below. It will take you to a list of all of our available [place] [facility], including address, phone number and  hours of operation. Please do not delay care.  [facility]  If you or a family member do not have a primary care provider, use the link below to schedule a visit and establish care. When you choose a [facility] primary care physician or advanced practice provider, you gain a long-term partner in health. Find a Primary Care Provider  Learn more about [facility]'s in-office and virtual care options: [facility] - Get Care Now

## 2022-05-30 ENCOUNTER — Other Ambulatory Visit (INDEPENDENT_AMBULATORY_CARE_PROVIDER_SITE_OTHER): Payer: BC Managed Care – PPO

## 2022-05-30 DIAGNOSIS — R7989 Other specified abnormal findings of blood chemistry: Secondary | ICD-10-CM

## 2022-05-30 DIAGNOSIS — R944 Abnormal results of kidney function studies: Secondary | ICD-10-CM

## 2022-05-30 LAB — COMPREHENSIVE METABOLIC PANEL
ALT: 42 U/L (ref 0–53)
AST: 25 U/L (ref 0–37)
Albumin: 4.1 g/dL (ref 3.5–5.2)
Alkaline Phosphatase: 76 U/L (ref 39–117)
BUN: 13 mg/dL (ref 6–23)
CO2: 30 mEq/L (ref 19–32)
Calcium: 9 mg/dL (ref 8.4–10.5)
Chloride: 101 mEq/L (ref 96–112)
Creatinine, Ser: 1.1 mg/dL (ref 0.40–1.50)
GFR: 84.54 mL/min (ref >60.00)
Glucose, Bld: 102 mg/dL — ABNORMAL HIGH (ref 70–99)
Potassium: 4.1 mEq/L (ref 3.5–5.1)
Sodium: 137 mEq/L (ref 135–145)
Total Bilirubin: 0.6 mg/dL (ref 0.2–1.2)
Total Protein: 7 g/dL (ref 6.0–8.3)

## 2022-06-02 NOTE — Progress Notes (Signed)
Labs ok.  Sugar up a little-doubt fasting.  Make sure drinking enough water and working on diet/exercise.  He can f//u in 6 mo and we'll repeat labs, or wait till physical in July

## 2022-06-03 ENCOUNTER — Encounter: Payer: Self-pay | Admitting: *Deleted

## 2022-08-22 ENCOUNTER — Encounter: Payer: Self-pay | Admitting: *Deleted

## 2022-09-26 DIAGNOSIS — L309 Dermatitis, unspecified: Secondary | ICD-10-CM | POA: Diagnosis not present

## 2022-09-26 DIAGNOSIS — L821 Other seborrheic keratosis: Secondary | ICD-10-CM | POA: Diagnosis not present

## 2022-09-26 DIAGNOSIS — L570 Actinic keratosis: Secondary | ICD-10-CM | POA: Diagnosis not present

## 2022-09-26 DIAGNOSIS — D235 Other benign neoplasm of skin of trunk: Secondary | ICD-10-CM | POA: Diagnosis not present

## 2022-09-26 DIAGNOSIS — L814 Other melanin hyperpigmentation: Secondary | ICD-10-CM | POA: Diagnosis not present

## 2023-02-18 ENCOUNTER — Ambulatory Visit: Payer: BC Managed Care – PPO | Admitting: Physician Assistant

## 2023-02-18 VITALS — BP 130/86 | HR 93 | Temp 98.2°F | Ht 69.0 in | Wt 257.4 lb

## 2023-02-18 DIAGNOSIS — M25571 Pain in right ankle and joints of right foot: Secondary | ICD-10-CM | POA: Diagnosis not present

## 2023-02-18 DIAGNOSIS — R239 Unspecified skin changes: Secondary | ICD-10-CM | POA: Diagnosis not present

## 2023-02-18 NOTE — Patient Instructions (Signed)
This may be a ganglion cyst of your right foot, but I would like for you to follow-up with sports medicine or podiatry at this time for confirmation.  Try to elevate foot at night above heart level. You may try ankle-compression socks.  Recheck sooner if redness, pain, or worsening symptoms.

## 2023-02-18 NOTE — Progress Notes (Signed)
Subjective:    Patient ID: Gregory Novak, male    DOB: 1981-03-27, 42 y.o.   MRN: [id number]  Chief Complaint  Patient presents with   Foot Swelling    Pt in office for ankle/foot swelling; in RLE, not sure any injury, pt recently done 6 mile walk, then next day a few mile walk and then following day bike ride; no pain per patient; pt states [facility] but not showing on file or in [facility];     HPI Patient is in today for new ?bumps / swollen areas around R ankle. Not red or tender. No fever. No SOB or other symptoms. Noticed it last night when he put his foot up to relax. Has been more active the last few days with walking / bike riding.   Past Medical History:  Diagnosis Date   Allergy    Heart murmur    child   History of chicken pox    Migraines    once-numbness 1/2 body    Past Surgical History:  Procedure Laterality Date   WISDOM TOOTH EXTRACTION      Family History  Problem Relation Age of Onset   Cancer Father 81       prostate   Asthma Sister    Hearing loss Maternal Grandmother    Cancer Maternal Grandmother 25       breast   Heart attack Maternal Grandfather    Hypertension Maternal Grandfather    Cancer Maternal Grandfather        lung   Cancer Paternal Grandmother    Diabetes Paternal Grandmother    Heart attack Paternal Grandfather    Pulmonary embolism Paternal Grandfather    Coronary artery disease Other        GP   Cancer Other        lung,skin,breast   Stroke Neg Hx    Colon cancer Neg Hx     Social History   Tobacco Use   Smoking status: Never   Smokeless tobacco: Never  Vaping Use   Vaping Use: Never used  Substance Use Topics   Alcohol use: Yes    Alcohol/week: 0.0 standard drinks of alcohol    Comment: 4-5 times weekly   Drug use: No     Allergies  Allergen Reactions   Naproxen Sodium Hives    Itchy hands-feet-hives    Bee Venom Hives    Review of Systems NEGATIVE UNLESS OTHERWISE INDICATED IN  HPI      Objective:     BP 130/86 (BP Location: Left Arm)   Pulse 93   Temp 98.2 F (36.8 C) (Temporal)   Ht 5\' 9"  (1.753 Novak)   Wt 257 lb 6.4 oz (116.8 kg)   SpO2 98%   BMI 38.01 kg/Novak   Wt Readings from Last 3 Encounters:  02/18/23 257 lb 6.4 oz (116.8 kg)  03/29/22 261 lb 6 oz (118.6 kg)  06/29/20 249 lb 12.8 oz (113.3 kg)    BP Readings from Last 3 Encounters:  02/18/23 130/86  03/29/22 122/84  06/29/20 118/84     Physical Exam  See photo of R heel / ankle listed below: There are three small subcutaneous soft, mobile, non-tender masses just posterior to lateral malleolus. No surrounding erythema or edema. Homan's negative. N/V intact RLE.       Assessment & Plan:  Skin change -     Ambulatory referral to Sports Medicine  Acute right ankle  pain -     Ambulatory referral to Sports Medicine    Reassured patient that exam looks benign - possibly ganglion cyst of the area vs small lymph nodes. Also asked him to examine higher in groin tonight to make sure no tick or other insect bite. Follow-up with sports medicine or podiatry at this time for confirmation.  Try to elevate foot at night above heart level. You may try ankle-compression socks.  Recheck sooner if redness, pain, or worsening symptoms.     Return if symptoms worsen or fail to improve.   Gregory Novak Gregory Bordelon, PA-C

## 2023-04-03 ENCOUNTER — Encounter: Payer: BC Managed Care – PPO | Admitting: Family Medicine

## 2023-05-06 ENCOUNTER — Encounter: Payer: Self-pay | Admitting: Physician Assistant

## 2023-06-01 ENCOUNTER — Telehealth: Payer: BC Managed Care – PPO | Admitting: Family

## 2023-06-01 DIAGNOSIS — T63421A Toxic effect of venom of ants, accidental (unintentional), initial encounter: Secondary | ICD-10-CM | POA: Diagnosis not present

## 2023-06-01 MED ORDER — TRIAMCINOLONE ACETONIDE 0.5 % EX OINT: 1.0000 | TOPICAL_OINTMENT | Freq: Two times a day (BID) | CUTANEOUS | 0 refills | Status: AC

## 2023-06-01 MED ORDER — PREDNISONE 20 MG PO TABS: 40.0000 mg | ORAL_TABLET | Freq: Every day | ORAL | 0 refills | Status: AC

## 2023-06-01 NOTE — Progress Notes (Signed)
E-Visit for Insect Sting  Thank you for describing the insect sting for [place].  Here is how we plan to help!  A sting that we will treat with a short course of prednisone.  The 2 greatest risks from insect stings are allergic reaction, which can be fatal in some people and infection, which is more common and less serious.  Bees, wasps, yellow jackets, and hornets belong to a class of insects called Hymenoptera.  Most insect stings cause only minor discomfort.  Stings can happen anywhere on the body and can be painful.  Most stings are from honey bees or yellow jackets.  Fire ants can sting multiple times.  The sites of the stings are more likely to become infected.    I have sent in prednisone 40 mg by mouth daily for 5 days to the pharmacy you selected.  Please make sure that you selected a pharmacy that is open now. I have also sent in Kenalog cream you can use twice a day to help with itching.   What can be used to prevent Insect Stings?  Insect repellant with at least 20% DEET.  Wearing long pants and shirts with socks and shoes.  Wear dark or drab-colored clothes rather than bright colors.  Avoid using perfumes and hair sprays; these attract insects.  HOME CARE ADVICE:  1. Stinger removal: The stinger looks like a tiny black dot in the sting. Use a fingernail, credit card edge, or knife-edge to scrape it off.  Don't pull it out because it squeezes out more venom. If the stinger is below the skin surface, leave it alone.  It will be shed with normal skin healing. 2. Use cold compresses to the area of the sting for 10-20 minutes.  You may repeat this as needed to relieve symptoms of pain and swelling. 3.  For pain relief, take acetominophen 650 mg 4-6 hours as needed or ibuprofen 400 mg every 6-8 hours as needed or naproxen 250-500 mg every 12 hours as needed. 4.  You can also use hydrocortisone cream 0.5% or 1% up to 4 times daily as needed for itching. 5.  If the sting becomes very  itchy, take Benadryl 25-50 mg, follow directions on box. 6.  Wash the area 2-3 times daily with antibacterial soap and warm water. 7. Call your Doctor if: Fever, a severe headache, or rash occur in the next 2 weeks. Sting area begins to look infected. Redness and swelling worsens after home treatment. Your current symptoms become worse.    MAKE SURE YOU:  Understand these instructions. Will watch your condition. Will get help right away if you are not doing well or get worse.  Thank you for choosing an e-visit.  Your e-visit answers were reviewed by a board certified advanced clinical practitioner to complete your personal care plan. Depending upon the condition, your plan could have included both over the counter or prescription medications.  Please review your pharmacy choice. Make sure the pharmacy is open so you can pick up prescription now. If there is a problem, you may contact your provider through [employer] and have the prescription routed to another pharmacy.  Your safety is important to [place]. If you have drug allergies check your prescription carefully.   For the next 24 hours you can use MyChart to ask questions about today's visit, request a non-urgent call back, or ask for a work or school excuse. You will get an email in the next two days asking about your  experience. I hope that your e-visit has been valuable and will speed your recovery.  Approximately 5 minutes was spent documenting and reviewing patient's chart.

## 2023-07-24 NOTE — Progress Notes (Signed)
Gregory Novak D.[facility] [address] [place] [address] Phone: [phone]   Assessment and Plan:     1. Ganglion cyst of right foot -Acute, initial sports medicine visit - Multiple cystic structures that are nontender, mobile and located superficially distal to right lateral malleolus.  Patient feels that they suddenly appeared and have not changed in size.  Suspected ganglion cysts, however cysts were noncompressible and we were unable to aspirate fluid.  Differential includes warts, lipoma, however I would not have expected these based on homogeneous hypoechoic appearance under ultrasound - Aspiration attempted with ultrasound guidance.  We were unable to aspirate any fluid from the structure.  Confident that needle was within lesion based on ultrasound. - Patient is not experiencing pain from lesions, however he would like removal of lesions for cosmetic reasons.  Will refer to general surgery for cyst removal  Procedure: Ultrasound Guided Ganglion Cyst Aspiration Side: Right ankle Diagnosis: Cystic lesions [place] Indication:  - accuracy is paramount for diagnosis - to ensure therapeutic efficacy or procedural success - to reduce procedural risk  After explaining the procedure, viable alternatives, risks, and answering any questions, consent was given verbally. The site was cleaned with chlorhexidine prep. An ultrasound transducer was placed on the lateral malleolus of right ankle and tracked distally to cystic structures.  There were numerous 4-5 cystic structures with homogeneous hypoechoic appearance under ultrasound.  Using sterile technique and equipment, 0.5 ml of 1% lidocaine without epinephrine was injected for anesthesia.  Then a large bore needle was inserted with ultrasound guidance into the ganglion cyst and no fluid was removed.   Needle was removed and dressing placed and post injection instructions were given including possible pain  after anesthetic wears off.  Pt was advised to call or return to clinic if these symptoms worsen or fail to improve as anticipated.   2. Chronic left shoulder pain  -Chronic with exacerbation, initial sports medicine visit - Most consistent with mild rotator cuff pathology over the past 3 months without specific MOI - Start HEP for rotator cuff - Start meloxicam 15 mg daily x2 weeks.  If still having pain after 2 weeks, complete 3rd-week of meloxicam. May use remaining meloxicam as needed once daily for pain control.  Do not to use additional NSAIDs while taking meloxicam.  May use Tylenol [phone] mg 2 to 3 times a day for breakthrough pain.  15 additional minutes spent for educating Therapeutic Home Exercise Program.  This included exercises focusing on stretching, strengthening, with focus on eccentric aspects.   Long term goals include an improvement in range of motion, strength, endurance as well as avoiding reinjury. Patient's frequency would include in 1-2 times a day, 3-5 times a week for a duration of 6-12 weeks. Proper technique shown and discussed handout in great detail with ATC.  All questions were discussed and answered.    Pertinent previous records reviewed include none  Follow Up: As needed if no improvement or worsening of symptoms in 3 to 4 weeks.  Could obtain left shoulder x-ray and discuss physical therapy versus subacromial CSI   Subjective:   I, Gregory Novak, am serving as a Neurosurgeon for Doctor Richardean Sale  Chief Complaint: right ankle pain   HPI:   07/25/2023 Patient is a 42 year old male with concerns of right ankle and L shoulder pain. Patient states that he has had cysts for a couple of months. Insidious onset. Do not change size and are  not painful. No pain, numbness or tingling. No history of cysts in any other part of body.   Also c/o L shoulder pain over AC joint. Painful at night to sleep on his side and if arm is flexed overhead. Painful to throw  baseball with his son.   Relevant Historical Information: None    Additional pertinent review of systems negative.   Current Outpatient Medications:    ALPRAZolam (XANAX) 0.5 MG tablet, Take 1 tablet (0.5 mg total) by mouth 2 (two) times daily as needed for anxiety or sleep., Disp: 10 tablet, Rfl: 0   EPINEPHrine 0.3 mg/0.3 mL IJ SOAJ injection, Inject 0.3 mg into the muscle as needed for anaphylaxis., Disp: 1 each, Rfl: 1   meloxicam (MOBIC) 15 MG tablet, Take 1 tablet (15 mg total) by mouth daily., Disp: 30 tablet, Rfl: 0   triamcinolone ointment (KENALOG) 0.5 %, Apply 1 Application topically 2 (two) times daily., Disp: 30 g, Rfl: 0   Objective:     Vitals:   07/25/23 0851  BP: 112/84  Pulse: 97  SpO2: 98%  Weight: 257 lb (116.6 kg)  Height: 5\' 9"  (1.753 m)      Body mass index is 37.95 kg/m.    Physical Exam:    Gen: Appears well, nad, nontoxic and pleasant Psych: Alert and oriented, appropriate mood and affect Neuro: sensation intact, strength is 5/5 with df/pf/inv/ev, muscle tone wnl Skin: no susupicious lesions or rashes  Right ankle:  No deformity, no swelling or effusion 3 superficial, mobile, nontender palpable lesions measuring <1 cm.  No open lesion, erythema, ecchymosis, warmth NTTP over fibular head, lat mal, medial mal, achilles, navicular, base of 5th, ATFL, CFL, deltoid, calcaneous or midfoot ROM DF 30, PF 45, inv/ev intact Negative ant drawer, talar tilt, rotation test, squeeze test. Neg thompson No pain with resisted inversion or eversion   Left shoulder:  No deformity, swelling or muscle wasting No scapular winging FF 180, abd 180, int 0, ext 90 NTTP over the [place], clavicle, ac, coracoid, biceps groove, humerus, deltoid, trapezius, cervical spine Positive Neer, [place]   Electronically signed by:  Gregory Novak D.[facility] 10:53 AM 07/25/23

## 2023-07-25 ENCOUNTER — Ambulatory Visit: Payer: BC Managed Care – PPO | Admitting: Sports Medicine

## 2023-07-25 ENCOUNTER — Other Ambulatory Visit: Payer: Self-pay

## 2023-07-25 VITALS — BP 112/84 | HR 97 | Ht 69.0 in | Wt 257.0 lb

## 2023-07-25 DIAGNOSIS — M67471 Ganglion, right ankle and foot: Secondary | ICD-10-CM

## 2023-07-25 DIAGNOSIS — M25512 Pain in left shoulder: Secondary | ICD-10-CM

## 2023-07-25 DIAGNOSIS — G8929 Other chronic pain: Secondary | ICD-10-CM

## 2023-07-25 MED ORDER — MELOXICAM 15 MG PO TABS: 15.0000 mg | ORAL_TABLET | Freq: Every day | ORAL | 0 refills | Status: AC

## 2023-07-25 NOTE — Patient Instructions (Addendum)
Referral to general surgery - Start meloxicam 15 mg daily x2 weeks.  If still having pain after 2 weeks, complete 3rd-week of meloxicam. May use remaining meloxicam as needed once daily for pain control.  Do not to use additional NSAIDs while taking meloxicam.  May use Tylenol [phone] mg 2 to 3 times a day for breakthrough pain. See me again in 3 weeks

## 2023-07-28 ENCOUNTER — Other Ambulatory Visit: Payer: Self-pay

## 2023-07-28 ENCOUNTER — Other Ambulatory Visit: Payer: Self-pay | Admitting: Sports Medicine

## 2023-07-28 DIAGNOSIS — G8929 Other chronic pain: Secondary | ICD-10-CM

## 2023-07-28 DIAGNOSIS — M67471 Ganglion, right ankle and foot: Secondary | ICD-10-CM

## 2023-07-28 NOTE — Progress Notes (Unsigned)
New referral sent.

## 2023-08-15 ENCOUNTER — Ambulatory Visit: Payer: BC Managed Care – PPO | Admitting: Sports Medicine

## 2023-10-02 DIAGNOSIS — L814 Other melanin hyperpigmentation: Secondary | ICD-10-CM | POA: Diagnosis not present

## 2023-10-02 DIAGNOSIS — D235 Other benign neoplasm of skin of trunk: Secondary | ICD-10-CM | POA: Diagnosis not present

## 2023-10-02 DIAGNOSIS — L821 Other seborrheic keratosis: Secondary | ICD-10-CM | POA: Diagnosis not present

## 2023-10-02 DIAGNOSIS — L853 Xerosis cutis: Secondary | ICD-10-CM | POA: Diagnosis not present

## 2024-07-16 DIAGNOSIS — S82015A Nondisplaced osteochondral fracture of left patella, initial encounter for closed fracture: Secondary | ICD-10-CM | POA: Diagnosis not present

## 2024-07-29 DIAGNOSIS — S82035D Nondisplaced transverse fracture of left patella, subsequent encounter for closed fracture with routine healing: Secondary | ICD-10-CM | POA: Diagnosis not present
